# Patient Record
Sex: Female | Born: 1985 | Race: White | Hispanic: No | Marital: Single | Smoking: Never smoker
Health system: Southern US, Community
[De-identification: ages and names within clinical notes are randomized; demographics above are authoritative.]

## PROBLEM LIST (undated history)

## (undated) DIAGNOSIS — E119 Type 2 diabetes mellitus without complications: Secondary | ICD-10-CM

## (undated) HISTORY — PX: AMPUTATION ARM: SHX6593

---

## 2008-11-30 ENCOUNTER — Emergency Department (HOSPITAL_BASED_OUTPATIENT_CLINIC_OR_DEPARTMENT_OTHER): Admission: EM | Admit: 2008-11-30 | Discharge: 2008-12-01 | Payer: Self-pay | Admitting: Emergency Medicine

## 2010-09-24 LAB — GLUCOSE, CAPILLARY
Glucose-Capillary: 269 mg/dL — ABNORMAL HIGH (ref 70–99)
Glucose-Capillary: 321 mg/dL — ABNORMAL HIGH (ref 70–99)

## 2010-09-24 LAB — BASIC METABOLIC PANEL
CO2: 23 mEq/L (ref 19–32)
Calcium: 9.1 mg/dL (ref 8.4–10.5)
Chloride: 103 mEq/L (ref 96–112)
Creatinine, Ser: 0.5 mg/dL (ref 0.4–1.2)
GFR calc Af Amer: >60 mL/min (ref >60)
Sodium: 137 mEq/L (ref 135–145)

## 2010-09-24 LAB — URINE MICROSCOPIC-ADD ON

## 2010-09-24 LAB — URINALYSIS, ROUTINE W REFLEX MICROSCOPIC
Bilirubin Urine: NEGATIVE
Glucose, UA: >1000 mg/dL — AB
Protein, ur: NEGATIVE mg/dL
Urobilinogen, UA: 0.2 mg/dL (ref 0.0–1.0)

## 2010-09-24 LAB — RAPID STREP SCREEN (MED CTR MEBANE ONLY): Streptococcus, Group A Screen (Direct): NEGATIVE

## 2011-10-30 LAB — COMPREHENSIVE METABOLIC PANEL
Alkaline Phosphatase: 128 U/L (ref 50–136)
Bilirubin,Total: 0.4 mg/dL (ref 0.2–1.0)
Chloride: 107 mmol/L (ref 98–107)
Co2: 29 mmol/L (ref 21–32)
EGFR (Non-African Amer.): >60
Glucose: 96 mg/dL (ref 65–99)
Osmolality: 283 (ref 275–301)
SGPT (ALT): 17 U/L
Total Protein: 7.1 g/dL (ref 6.4–8.2)

## 2011-10-30 LAB — CBC
HCT: 39.1 % (ref 35.0–47.0)
HGB: 12.8 g/dL (ref 12.0–16.0)
MCH: 27.8 pg (ref 26.0–34.0)
MCHC: 32.8 g/dL (ref 32.0–36.0)
RBC: 4.61 10*6/uL (ref 3.80–5.20)
RDW: 14.1 % (ref 11.5–14.5)
WBC: 7.6 10*3/uL (ref 3.6–11.0)

## 2011-10-30 LAB — URINALYSIS, COMPLETE
Glucose,UR: >=500 mg/dL (ref 0–75)
Leukocyte Esterase: NEGATIVE
Nitrite: NEGATIVE
Specific Gravity: 1.027 (ref 1.003–1.030)
Squamous Epithelial: 2
WBC UR: 3 /HPF (ref 0–5)

## 2011-10-30 LAB — ACETAMINOPHEN LEVEL: Acetaminophen: 10 ug/mL

## 2011-10-30 LAB — DRUG SCREEN, URINE
Amphetamines, Ur Screen: NEGATIVE (ref ?–1000)
Barbiturates, Ur Screen: NEGATIVE (ref ?–200)
Benzodiazepine, Ur Scrn: POSITIVE (ref ?–200)
Cannabinoid 50 Ng, Ur ~~LOC~~: POSITIVE (ref ?–50)
MDMA (Ecstasy)Ur Screen: NEGATIVE (ref ?–500)
Opiate, Ur Screen: POSITIVE (ref ?–300)

## 2011-10-30 LAB — SALICYLATE LEVEL: Salicylates, Serum: <1.7 mg/dL

## 2011-10-31 ENCOUNTER — Inpatient Hospital Stay: Payer: Self-pay | Admitting: Psychiatry

## 2011-10-31 LAB — HEMOGLOBIN A1C: Hemoglobin A1C: 10.9 % — ABNORMAL HIGH (ref 4.2–6.3)

## 2014-10-09 NOTE — Discharge Summary (Signed)
PATIENT NAME:  Jessica Tapia, Jessica Tapia MR#:  [id number] DATE OF BIRTH:  [birth year]  DATE OF ADMISSION:  10/31/2011 DATE OF DISCHARGE:  10/31/2011  HISTORY OF PRESENT ILLNESS: Ms. Jessica Tapia presented to the emergency department after an overdose of methadone yesterday. When she presented to the emergency department, she had reported that she was diabetic and her glucose had gotten out of control, and she needed some emergent evaluation and treatment of her diabetes.    Later on she acknowledged to the undersign that she presented to the emergency department because she knew that she had taken methadone instead of Percocet. Please see below.  After the patient arrived to the emergency department, she fell into a coma. The emergency department physician gave her Narcan and the patient immediately became alert. She received Narcan dosages up to 6:15 p.m. on 10/30/2011. By midnight of 10/30/2011, the patient remained able to ambulate and was safely out of the methadone influence.   The patient, after becoming able to converse, told the undersign that she had deliberately presented to the emergency department because she was afraid of what would happen to her after the methadone overdose. She gave this statement and story multiple times to multiple treatment team providers during her brief hospital stay; she stated that she did run out of Percocet and turned to her next door neighbor to get a supply. Her next door neighbor gave her methadone instead of Percocet. When the patient began to feel the sedation effect, she then came to the emergency department.   ANCILLARY CLINICAL DATA:  Ms. Jessica Tapia was placed on a NovoLog sliding scale for her glucose. [facility] consulted on the patient for diabetes medication adjustment.   HOSPITAL COURSE: The next day after presenting to the emergency department, Ms. Jessica Tapia was not showing any signs of internal stimulation. She denied any thoughts of harming herself or others. As  mentioned above, she was consistent with her account of what happened. She was describing constructive future goals and hope. She was not showing any disorientation or memory dysfunction.   The patient did repeat to a number of treatment team members that she had been successfully involved in a law suit against a drug company and was going to receive a settlement. This settlement was going to allow her to purchase a house in the [place] area and to be able to bring her children to [place]. Although the actual facts of the matter were not researched, the patient demonstrated no evidence that she was delusional. She had no other content did indicated delusions of grandeur.  CONDITION ON DISCHARGE: As above.   MENTAL STATUS EXAMINATION UPON DISCHARGE: Ms. Jessica Tapia is alert. Her eye contact is good. Her concentration is normal. She is oriented to all spheres. Her memory is intact to immediate, recent, and remote except for the methadone blackout. Her speech shows no dysarthria. Thought process is logical, coherent, and goal directed with no looseness of associations, no tangents. Thought content: No thoughts of harming herself or others. No delusions or hallucinations. Her affect is anxious and at times she shows some histrionic features, however, there is no expansiveness or excessive euphoria. Mood consistent with affect. She is not depressed. She is looking forward to the future. She is grateful that she no longer meets commitment criteria. Her insight is poor regarding the extent of her substance abuse. Her judgment is intact.   ADDITIONAL DATA FROM LABORATORY: Her urine drug screen was positive for cocaine, positive for opiates, and positive for cannabinoids.  ASSESSMENT:   AXIS I:  1. Polysubstance dependence.  2. Anxiety disorder, not otherwise specified.   AXIS II: Deferred.   AXIS III: Diabetes mellitus type 1.   AXIS IV: General medical, primary support group.   AXIS V: 55.    Ms. Jessica Tapia is not at risk to harm himself or others. She agrees to call emergency services immediately for thoughts of harming herself, thoughts of harming others, or distress.   The undersigned emphatically recommended that Ms. Jessica Tapia remain in the psychiatric inpatient unit for further group psychotherapy and to allow time for further education as well as facilitate drug dependence rehabilitation.   The patient would be an excellent candidate for remaining in our behavioral health unit until a residential chemical dependence rehabilitation program could become available for her direct transfer.   However, the patient declines and she is not committable now that she is recovered from acute substance intoxication.  She will sign out AGAINST MEDICAL ADVICE.  She understands that she can return if she changes her mind. ADS information for outpatient services is also given. She is also a candidate for the 12-step method and obtaining a 12-step sponsor. ____________________________ Adelene Amas. Ruqayya Ventress, MD jsw:slb D: 11/01/2011 20:17:28 ET T: 11/02/2011 08:08:45 ET JOB#: [id number]  cc: Adelene Amas. Lygia Olaes, MD, <Dictator> Lester [place] MD ELECTRONICALLY SIGNED 11/05/2011 12:32

## 2014-10-09 NOTE — Discharge Summary (Signed)
PATIENT NAMLeane Para:  Tapia, Jessica Tapia MR#:  [id number] DATE OF BIRTH:  Mar 07, 1986  DATE OF ADMISSION:  10/31/2011 DATE OF DISCHARGE:  10/31/2011  ADDENDUM: Followup appointment was given at [facility] at 10:30 a.m. on 11/06/2011.  DISCHARGE MEDICATIONS:  1. Propranolol 80 mg 1 twice a day. 2. Tapazole 10 mg one every 8 hours. 3. She was to continue on her insulin regimen as recommended by the general medical MD. ____________________________ Adelene AmasJames S. Mardie Kellen, MD jsw:slb D: 11/01/2011 21:35:36 ET T: 11/02/2011 08:34:17 ET JOB#: [id number]  cc: Adelene AmasJames S. Roben Schliep, MD, <Dictator> Lester CarolinaJAMES S Saidah Kempton MD ELECTRONICALLY SIGNED 11/05/2011 12:32

## 2014-10-09 NOTE — Consult Note (Signed)
PATIENT NAME:  Jessica Tapia, SPELLMAN MR#:  [id number] DATE OF BIRTH:  02-Apr-1986  DATE OF CONSULTATION:  10/31/2011  REFERRING PHYSICIAN:  Dr. Jeanie Sewer  CONSULTING PHYSICIAN:  Rolly Pancake. Cherlynn Kaiser, MD  PRIMARY CARE PHYSICIAN: Dr. August Saucer  CHIEF COMPLAINT/REASON FOR CONSULTATION: Hyperglycemia.   HISTORY OF PRESENT ILLNESS: This is a 29 year old female with past medical history of type 1 diabetes, diabetic neuropathy, history of Graves' disease who presented to the hospital secondary to drug overdose secondary to methadone. This reversed with Narcan and she was admitted to Behavioral Medicine. Hospitalist services were contacted for hyperglycemia. Patient's blood sugars have been in the 400 range pretty much most of the day.   Patient clinically is asymptomatic.   REVIEW OF SYSTEMS: CONSTITUTIONAL: No documented fever. No weight gain, no weight loss. EYES: No blurry or double vision. ENT: No tinnitus. No postnasal drip. No redness of the oropharynx. RESPIRATORY: No cough, no wheeze, no hemoptysis. CARDIOVASCULAR: No chest pain, no orthopnea, no palpitations, no syncope. GASTROINTESTINAL: No nausea, no vomiting, no diarrhea, no abdominal pain, no melena, no hematochezia. GENITOURINARY: No dysuria, no hematuria. ENDOCRINE: No polyuria or nocturia. No heat or cold intolerance. HEME: No anemia. No bruising. No bleeding. INTEGUMENTARY: No rashes, no lesions. MUSCULOSKELETAL: No arthritis, no swelling, no gout. NEUROLOGIC: No numbness, no tingling, no ataxia, no seizure-type activity. PSYCH: No anxiety, no insomnia, no ADD.   PAST MEDICAL HISTORY:  1. Type 1 diabetes, insulin-dependent. 2. Graves' disease.  3. Diabetic neuropathy.  4. History of opioid abuse on methadone program.    ALLERGIES: Sulfa drugs which causes hives.   SOCIAL HISTORY: Does smoke about a pack per day, has been smoking since she was age 42. No alcohol abuse. No other illicit drug abuse so she says but her urine toxicology is positive for  opiates, methadone, cannabinoids and also cocaine and benzodiazepines.   CURRENT MEDICATIONS:  1. Tylenol with hydrocodone 10/325, 1 tab every eight hours as needed. Amoxicillin 500 mg t.i.d.  2. Klonopin 1 mg b.i.d. as needed for anxiety.  3. Methimazole 10 mg t.i.d.  4. Propranolol 80 mg b.i.d.   PHYSICAL EXAMINATION:  VITAL SIGNS: Temperature 97.2, pulse 114, respirations 18, blood pressure 112/72. Blood sugar now is down to 99 but prior to that was in the 400s to 500s range.   GENERAL: She is a very anxious appearing female with a pressured speech but in no apparent distress.   HEENT: Atraumatic, normocephalic. Her extraocular muscles are intact. Pupils equal, reactive to light. Sclerae anicteric. No conjunctival injection. No oropharyngeal erythema.   NECK: Supple. There is no jugular venous distention, no bruits, no lymphadenopathy, no thyromegaly.   HEART: Tachycardic, regular. No murmurs, no rubs, no clicks.   LUNGS: Clear to auscultation bilaterally. No rales, no rhonchi, no wheezes.   ABDOMEN: Soft, flat, nontender, nondistended. Has good bowel sounds. No hepatosplenomegaly appreciated.   EXTREMITIES: No evidence of any cyanosis, clubbing, or peripheral edema. Has +2 pedal and radial pulses bilaterally.   NEUROLOGICAL: Patient is alert, awake, oriented x3, very anxious but no other focal motor or sensory deficits appreciated bilaterally.   SKIN: Moist, warm with no rash appreciated.   LYMPHATIC: There is no cervical or axillary lymphadenopathy.   LABORATORY, DIAGNOSTIC AND RADIOLOGICAL DATA: Laboratory exam done yesterday shows BUN 7, creatinine 0.5, sodium 143, potassium 3.4, chloride 107, bicarbonate 29. LFTs are within normal limits. Free thyroxine 2.52. TSH less than 0.010. White cell count 7.6, hemoglobin 12.8, hematocrit 39.1, platelet count 196.   ASSESSMENT AND PLAN:  This is a 29 year old female with a history of type 1 diabetes, Graves' disease, diabetic  neuropathy, polysubstance abuse came into the hospital due to drug overdose from methadone.  1. Type 1 diabetes. Patient's sugars are significantly uncontrolled. Her blood sugars were as high as 400 to 500 ranger earlier today. Patient has received some regular insulin and sliding scale coverage and sugars have improved. As per the patient she is on 70/30 insulin along with sliding scale coverage. I will start her on low dose 70/30 coverage for now and continue sliding scale. Will check hemoglobin A1c and follow her blood sugars.  2. Graves' disease. Patient's TSH is significantly low and free T3 is elevated. She likely has this diagnosis. I will restart her methimazole and also propranolol for now. I would recommend getting an outpatient follow up with endocrinology as she likely will need radioactive ablation at some point once her thyroid levels come down.  3. Tachycardia. I think this is likely related to her hyperthyroid state. As mentioned, I went ahead and started her propranolol which should help with this.  4. Diabetic neuropathy. Patient is currently on no acute medications related to this. If needed we can start some low dose Neurontin.  5. Drug overdose, polysubstance abuse. Patient apparently took a higher dose of methadone, was reversed with Narcan, appears to have pressured speech and is going through withdrawal. Would continue care as per psychiatry for now.  6. Tobacco abuse. Continue with nicotine patch.   Thanks for the consult. Will follow along with you.  TIME SPENT: 50 minutes.  ____________________________ Rolly PancakeVivek J. Cherlynn KaiserSainani, MD vjs:cms D: 10/31/2011 13:06:35 ET T: 10/31/2011 13:24:53 ET  JOB#: [id number] cc: Rolly PancakeVivek J. Cherlynn KaiserSainani, MD, <Dictator> Dr. Rodell Pernaean   VIVEK J SAINANI MD ELECTRONICALLY SIGNED 10/31/2011 15:43

## 2014-10-09 NOTE — Consult Note (Signed)
Brief Consult Note: Diagnosis: 1. Type I DM 2. Grave's Disease 3. Diabetic Neuropathy 4. Methadone overdose.   Patient was seen by consultant.   Consult note dictated.   Orders entered.   Comments: 29 yo female w/ hx of Type 1 DM, Grave's disease, diabetic Neuropathy, came into hospital due to Drug overdose from Methadone.   1. Type I DM - uncontrolled sugars.  - will check HemoglobinA1c.  Pt. is on 70/30 insulin w/ SSI coverage.  - will start low dose 70/30 and SSI coverage and monitor.   2. Grave's Disease  - restart Methimazole, Propranolol for now.  - recommend outpatient follow up w/ Endocrinology.   3. Diabetic Neuropathy - no acute issue  4. Drug Overdose/Mania - cont. care as per Psychiatry.   5. Tobacco abuse - cont. Nicotine patch.   thanks of the consult and follow with you.   Full Code  Job # [id number].  Electronic Signatures: [place] SirenSainani, Latori Beggs J (MD)  (Signed 16-May-13 13:06)  Authored: Brief Consult Note   Last Updated: 16-May-13 13:06 by [place] SirenSainani, Kellyn Mansfield J (MD)

## 2014-10-09 NOTE — H&P (Signed)
PATIENT NAMENALLA, PURDY MR#:  [id number] DATE OF BIRTH:  18-Sep-1985  DATE OF ADMISSION: 10/31/2011  CHIEF COMPLAINT AND IDENTIFYING DATA: Ms. Tyshauna Finkbiner is a 29 year old female presenting to the Emergency Department after taking an overdose of methadone. The methadone overdose was discovered after the patient was in a coma and then immediately returned to completely alert after receiving Narcan.   HISTORY OF PRESENT ILLNESS: The patient was being vague with some of her history. She states that she had run out of Percocet and that a lady next door gave her some pills. Her neighbor described them as Percocet. The patient took an unknown amount. The patient, at the time of the undersigned's exam is still groggy, however, she is oriented to all spheres. Her memory function is still intact except for the memory gap involving the overdose. She denies any thoughts of harming herself or others. She has been placed on involuntary commitment. She has no hallucinations or delusions.   However, she continues with constricted affect, depressed mood and sobbing. She describes how her children were taken away by [employer] when she was a 29 year old and she has recently been in a position to receive them back, however, she has found herself with this current complication and is very worried that this will undermine the return of her children. She has not been on any psychotropic medication other than some Klonopin for feeling on edge prescribed by her primary care physician at the dosage of 1 mg b.i.d. She does have a history of excessive worry, feeling on edge, as well as muscle tension.   She has been given Elavil 10 mg at bedtime for her diabetic peripheral neuropathy by her primary care physician.   PAST PSYCHIATRIC HISTORY: She was admitted to an inpatient psychiatric unit at the age of 10 due to some behavioral disturbance. The specific nature of this disturbance is not known. At this time the patient does  acknowledge that it was due to drug problems and that this eventually resulted in her two children being taken by DSS at the time.   The patient denies any alcohol use.   FAMILY PSYCHIATRIC HISTORY: None known.   SOCIAL HISTORY: Ms. Schaff has been residing with her boyfriend. She recently moved up from [place], [place]. She denies illegal drugs or alcohol. Please see the above history regarding the methadone. She states that she has been taking the equivalent of two 10 mg oxycodone tablets per day on a regular basis. Her two children are living with her father in [place], [place].   Occupation: Disabled. She has critical diabetes mellitus. Please see below. She is currently living and [place].   PAST MEDICAL HISTORY: 1. Diabetes mellitus, type 1. The patient states that she developed the diabetes mellitus after a course of Seroquel and that she is suing the company that makes Seroquel. She states that this law suit is getting ready to settle and that she will have a very substantial financial settlement.  2. She has diabetic peripheral neuropathy with significant intermittent pain.   ALLERGIES: No known drug allergies.   MEDICATIONS: As above insulin she states is Lantus 70/30, 4 units b.i.d. she uses a sliding scale of NovoLog.   LABORATORY, DIAGNOSTIC AND RADIOLOGICAL DATA: Alcohol, CBC and complete metabolic panel unremarkable. TSH less than 1.010 and the patient does state that she has history of Graves' disease.   REVIEW OF SYSTEMS: Constitutional, HEENT, mouth, neurologic, psychiatric, cardiovascular, respiratory, gastrointestinal, genitourinary, skin, musculoskeletal, hematologic, lymphatic, endocrine, metabolic all  unremarkable.   PHYSICAL EXAMINATION:  VITAL SIGNS: Temperature 97.0, pulse 82, respiratory rate 18, blood pressure 91/55.   The patient was examined with Emergency Department female staff escort.   GENERAL APPEARANCE: Well-developed, well-nourished young  female appearing her chronologic age with no abnormal involuntary movements. She has no cachexia. Grooming mildly disheveled. Hygiene normal.  HEAD: Normocephalic, atraumatic.   OROPHARYNX: Clear without erythema.   EXTREMITIES: No cyanosis, clubbing, or edema.    SKIN: Normal turgor. No rashes.   NECK: Supple, nontender, no masses.   LUNGS: Clear to auscultation. No wheezing, rhonchi, or rales.   CARDIOVASCULAR: Regular rate and rhythm. No murmurs, rubs, or gallops.   ABDOMEN: Nondistended. Bowel sounds positive, soft, nontender, no masses.   GENITOURINARY: Deferred.   NEUROLOGIC: Cranial nerves II through XII intact. General sensory intact throughout to light touch. Motor 5/5 strength throughout. Coordination intact finger to nose bilaterally. Deep tendon reflexes normal strength and symmetry throughout. No Babinski.   MENTAL STATUS EXAM: Ms. Logan Boresvans is still showing decreased concentration and grogginess. Her eye contact is intermittent. She is oriented to all spheres. Her memory is intact to immediate, recent, and remote except for the overdose blackout. Her fund of knowledge, use of language and intelligence are grossly normal. Speech involves a slightly decreased rate, slightly flat prosody, slight dysarthria. Thought process is logical, coherent, and goal directed. No looseness of associations. No tangents. Thought content: She denies any suicidal intent. She has no thoughts of harming others. She denies hallucinations or delusions. Affect is constricted with tears. Mood is depressed. Insight is poor. Judgment is impaired.   ASSESSMENT:  AXIS I:  1. Mood disorder, not otherwise specified.  2. Anxiety disorder, not otherwise specified.  3. Rule out polysubstance dependence.   AXIS II: Deferred.   AXIS III: Diabetes mellitus, type 1.   AXIS IV: General medical, primary support group, economic.   AXIS V: 30.   Ms. Ludmilla Gapinski clearly has impaired judgement as well as  insight. She is just coming off of a potentially lethal overdose. She is at risk for lethal self neglect. Also suicidal intent with the overdose has not been ruled out. She is under involuntary commitment.   PLAN:  1. Therefore, the undersigned will admit Ms. Damron to the inpatient behavioral health unit.  2. She will undergo milieu and group psychotherapy.  3. At this time she will be placed on a Novolin sliding scale with a general medical consult for dosing her Lantus insulin.  4. Regarding psychotropic agents, she will be monitored for any need to start antidepressant or modify anti-anxiety therapy. Ongoing use of benzodiazepines will be discouraged as much as possible.   ____________________________ Adelene AmasJames S. Jasmene Goswami, MD jsw:cms D: 10/31/2011 11:24:00 ET T: 10/31/2011 11:49:13 ET JOB#: [id number]  cc: Adelene AmasJames S. Daje Stark, MD, <Dictator> Lester CarolinaJAMES S Oceana Walthall MD ELECTRONICALLY SIGNED 10/31/2011 16:28

## 2015-05-31 ENCOUNTER — Emergency Department (HOSPITAL_COMMUNITY): Payer: Self-pay

## 2015-05-31 ENCOUNTER — Emergency Department (HOSPITAL_COMMUNITY)
Admission: EM | Admit: 2015-05-31 | Discharge: 2015-05-31 | Disposition: A | Discharge status: Home / Self Care | Payer: Self-pay | Attending: Emergency Medicine | Admitting: Emergency Medicine

## 2015-05-31 ENCOUNTER — Encounter (HOSPITAL_COMMUNITY): Payer: Self-pay | Admitting: Emergency Medicine

## 2015-05-31 DIAGNOSIS — R739 Hyperglycemia, unspecified: Secondary | ICD-10-CM

## 2015-05-31 DIAGNOSIS — Z3202 Encounter for pregnancy test, result negative: Secondary | ICD-10-CM | POA: Insufficient documentation

## 2015-05-31 DIAGNOSIS — Z79899 Other long term (current) drug therapy: Secondary | ICD-10-CM | POA: Insufficient documentation

## 2015-05-31 DIAGNOSIS — E1165 Type 2 diabetes mellitus with hyperglycemia: Secondary | ICD-10-CM | POA: Insufficient documentation

## 2015-05-31 DIAGNOSIS — R109 Unspecified abdominal pain: Secondary | ICD-10-CM | POA: Insufficient documentation

## 2015-05-31 DIAGNOSIS — E860 Dehydration: Secondary | ICD-10-CM | POA: Insufficient documentation

## 2015-05-31 HISTORY — DX: Type 2 diabetes mellitus without complications: E11.9

## 2015-05-31 LAB — URINALYSIS, ROUTINE W REFLEX MICROSCOPIC
BILIRUBIN URINE: NEGATIVE
Glucose, UA: >1000 mg/dL — AB
Hgb urine dipstick: NEGATIVE
KETONES UR: NEGATIVE mg/dL
LEUKOCYTES UA: NEGATIVE
NITRITE: NEGATIVE
PROTEIN: NEGATIVE mg/dL
Specific Gravity, Urine: 1.027 (ref 1.005–1.030)
pH: 7 (ref 5.0–8.0)

## 2015-05-31 LAB — COMPREHENSIVE METABOLIC PANEL
ALK PHOS: 386 U/L — AB (ref 38–126)
ALT: 35 U/L (ref 14–54)
AST: 28 U/L (ref 15–41)
Albumin: 3 g/dL — ABNORMAL LOW (ref 3.5–5.0)
Anion gap: 13 (ref 5–15)
BILIRUBIN TOTAL: 0.4 mg/dL (ref 0.3–1.2)
BUN: 11 mg/dL (ref 6–20)
CALCIUM: 8.2 mg/dL — AB (ref 8.9–10.3)
CO2: 22 mmol/L (ref 22–32)
Chloride: 86 mmol/L — ABNORMAL LOW (ref 101–111)
Creatinine, Ser: 0.74 mg/dL (ref 0.44–1.00)
GFR calc Af Amer: >60 mL/min (ref >60)
Glucose, Bld: 964 mg/dL (ref 65–99)
POTASSIUM: 4.6 mmol/L (ref 3.5–5.1)
Sodium: 121 mmol/L — ABNORMAL LOW (ref 135–145)
TOTAL PROTEIN: 6.6 g/dL (ref 6.5–8.1)

## 2015-05-31 LAB — BLOOD GAS, VENOUS
ACID-BASE DEFICIT: 2.2 mmol/L — AB (ref 0.0–2.0)
BICARBONATE: 22.6 meq/L (ref 20.0–24.0)
FIO2: 0.21
O2 SAT: 91 %
PATIENT TEMPERATURE: 98.6
TCO2: 20.7 mmol/L (ref 0–100)
pCO2, Ven: 41 mmHg — ABNORMAL LOW (ref 45.0–50.0)
pH, Ven: 7.359 — ABNORMAL HIGH (ref 7.250–7.300)
pO2, Ven: 70 mmHg — ABNORMAL HIGH (ref 30.0–45.0)

## 2015-05-31 LAB — RAPID URINE DRUG SCREEN, HOSP PERFORMED
Amphetamines: NOT DETECTED
BARBITURATES: NOT DETECTED
Benzodiazepines: NOT DETECTED
Cocaine: NOT DETECTED
Opiates: NOT DETECTED
Tetrahydrocannabinol: NOT DETECTED

## 2015-05-31 LAB — I-STAT BETA HCG BLOOD, ED (MC, WL, AP ONLY)

## 2015-05-31 LAB — URINE MICROSCOPIC-ADD ON

## 2015-05-31 LAB — CBG MONITORING, ED
GLUCOSE-CAPILLARY: 405 mg/dL — AB (ref 65–99)
Glucose-Capillary: >600 mg/dL (ref 65–99)
Glucose-Capillary: >600 mg/dL (ref 65–99)

## 2015-05-31 LAB — LIPASE, BLOOD: LIPASE: 44 U/L (ref 11–51)

## 2015-05-31 MED ORDER — INSULIN ASPART 100 UNIT/ML ~~LOC~~ SOLN
10.0000 [IU] | Freq: Once | SUBCUTANEOUS | Status: AC
  Administered 2015-05-31: 10 [IU] via INTRAVENOUS
  Filled 2015-05-31: qty 1

## 2015-05-31 MED ORDER — IOHEXOL 300 MG/ML  SOLN
50.0000 mL | Freq: Once | INTRAMUSCULAR | Status: DC | PRN
  Administered 2015-05-31: 50 mL via ORAL
  Filled 2015-05-31: qty 50

## 2015-05-31 MED ORDER — FENTANYL CITRATE (PF) 100 MCG/2ML IJ SOLN
50.0000 ug | INTRAMUSCULAR | Status: DC | PRN
  Administered 2015-05-31: 50 ug via INTRAVENOUS
  Filled 2015-05-31: qty 2

## 2015-05-31 MED ORDER — SODIUM CHLORIDE 0.9 % IV SOLN
1000.0000 mL | Freq: Once | INTRAVENOUS | Status: AC
  Administered 2015-05-31: 1000 mL via INTRAVENOUS

## 2015-05-31 MED ORDER — IOHEXOL 300 MG/ML  SOLN
100.0000 mL | Freq: Once | INTRAMUSCULAR | Status: AC | PRN
  Administered 2015-05-31: 100 mL via INTRAVENOUS

## 2015-05-31 MED ORDER — SODIUM CHLORIDE 0.9 % IV BOLUS (SEPSIS)
1000.0000 mL | Freq: Once | INTRAVENOUS | Status: AC
  Administered 2015-05-31: 1000 mL via INTRAVENOUS

## 2015-05-31 MED ORDER — ONDANSETRON HCL 4 MG/2ML IJ SOLN
4.0000 mg | Freq: Once | INTRAMUSCULAR | Status: DC
Start: 2015-05-31 — End: 2015-05-31

## 2015-05-31 MED ORDER — SODIUM CHLORIDE 0.9 % IV SOLN
1000.0000 mL | INTRAVENOUS | Status: DC
  Administered 2015-05-31: 1000 mL via INTRAVENOUS

## 2015-05-31 MED ORDER — PROMETHAZINE HCL 25 MG PO TABS: 25.0000 mg | ORAL_TABLET | Freq: Four times a day (QID) | ORAL | Status: DC | PRN

## 2015-05-31 MED ORDER — KETOROLAC TROMETHAMINE 30 MG/ML IJ SOLN
30.0000 mg | Freq: Once | INTRAMUSCULAR | Status: AC
  Administered 2015-05-31: 30 mg via INTRAVENOUS
  Filled 2015-05-31: qty 1

## 2015-05-31 MED ORDER — HALOPERIDOL LACTATE 5 MG/ML IJ SOLN
2.0000 mg | Freq: Once | INTRAMUSCULAR | Status: AC
  Administered 2015-05-31: 2 mg via INTRAVENOUS
  Filled 2015-05-31: qty 1

## 2015-05-31 NOTE — ED Notes (Signed)
Pt is refusing blood draw until she speaks with the MD about pain meds

## 2015-05-31 NOTE — ED Notes (Signed)
MD at bedside. 

## 2015-05-31 NOTE — ED Notes (Signed)
This Consulting civil engineerCharge RN was informed by the Primary RN that the Pt is upset w/ EDP's orders and wanting to leave.  This Probation officerCharge RN and Primary RN spoke w/ the Pt.  Pt demanding IV Benadryl and "something stronger" for pain.  Pt reports that all medications make her itch and she knows that Toradol will not work.  This Consulting civil engineerCharge RN and the Primary RN informed the Pt of our concerns w/ her leaving.  Eventually, the Pt agreed to stay and allow us to complete the EDP's orders.

## 2015-05-31 NOTE — Discharge Instructions (Signed)
Hyperglycemia °Hyperglycemia occurs when the glucose (sugar) in your blood is too high. Hyperglycemia can happen for many reasons, but it most often happens to people who do not know they have diabetes or are not managing their diabetes properly.  °CAUSES  °Whether you have diabetes or not, there are other causes of hyperglycemia. Hyperglycemia can occur when you have diabetes, but it can also occur in other situations that you might not be as aware of, such as: °Diabetes °· If you have diabetes and are having problems controlling your blood glucose, hyperglycemia could occur because of some of the following reasons: °¨ Not following your meal plan. °¨ Not taking your diabetes medications or not taking it properly. °¨ Exercising less or doing less activity than you normally do. °¨ Being sick. °Pre-diabetes °· This cannot be ignored. Before people develop Type 2 diabetes, they almost always have "pre-diabetes." This is when your blood glucose levels are higher than normal, but not yet high enough to be diagnosed as diabetes. Research has shown that some long-term damage to the body, especially the heart and circulatory system, may already be occurring during pre-diabetes. If you take action to manage your blood glucose when you have pre-diabetes, you may delay or prevent Type 2 diabetes from developing. °Stress °· If you have diabetes, you may be "diet" controlled or on oral medications or insulin to control your diabetes. However, you may find that your blood glucose is higher than usual in the hospital whether you have diabetes or not. This is often referred to as "stress hyperglycemia." Stress can elevate your blood glucose. This happens because of hormones put out by the body during times of stress. If stress has been the cause of your high blood glucose, it can be followed regularly by your caregiver. That way he/she can make sure your hyperglycemia does not continue to get worse or progress to  diabetes. °Steroids °· Steroids are medications that act on the infection fighting system (immune system) to block inflammation or infection. One side effect can be a rise in blood glucose. Most people can produce enough extra insulin to allow for this rise, but for those who cannot, steroids make blood glucose levels go even higher. It is not unusual for steroid treatments to "uncover" diabetes that is developing. It is not always possible to determine if the hyperglycemia will go away after the steroids are stopped. A special blood test called an A1c is sometimes done to determine if your blood glucose was elevated before the steroids were started. °SYMPTOMS °· Thirsty. °· Frequent urination. °· Dry mouth. °· Blurred vision. °· Tired or fatigue. °· Weakness. °· Sleepy. °· Tingling in feet or leg. °DIAGNOSIS  °Diagnosis is made by monitoring blood glucose in one or all of the following ways: °· A1c test. This is a chemical found in your blood. °· Fingerstick blood glucose monitoring. °· Laboratory results. °TREATMENT  °First, knowing the cause of the hyperglycemia is important before the hyperglycemia can be treated. Treatment may include, but is not be limited to: °· Education. °· Change or adjustment in medications. °· Change or adjustment in meal plan. °· Treatment for an illness, infection, etc. °· More frequent blood glucose monitoring. °· Change in exercise plan. °· Decreasing or stopping steroids. °· Lifestyle changes. °HOME CARE INSTRUCTIONS  °· Test your blood glucose as directed. °· Exercise regularly. Your caregiver will give you instructions about exercise. Pre-diabetes or diabetes which comes on with stress is helped by exercising. °· Eat wholesome,   balanced meals. Eat often and at regular, fixed times. Your caregiver or nutritionist will give you a meal plan to guide your sugar intake.  Being at an ideal weight is important. If needed, losing as little as 10 to 15 pounds may help improve blood  glucose levels. SEEK MEDICAL CARE IF:   You have questions about medicine, activity, or diet.  You continue to have symptoms (problems such as increased thirst, urination, or weight gain). SEEK IMMEDIATE MEDICAL CARE IF:   You are vomiting or have diarrhea.  Your breath smells fruity.  You are breathing faster or slower.  You are very sleepy or incoherent.  You have numbness, tingling, or pain in your feet or hands.  You have chest pain.  Your symptoms get worse even though you have been following your caregiver's orders.  If you have any other questions or concerns.   This information is not intended to replace advice given to you by your health care provider. Make sure you discuss any questions you have with your health care provider.   Document Released: 11/27/2000 Document Revised: 08/26/2011 Document Reviewed: 02/07/2015 Elsevier Interactive Patient Education 2016 [employer]   Emergency Department Resource Guide 1) Find a Doctor and Pay Out of Pocket Although you won't have to find out who is covered by your insurance plan, it is a good idea to ask around and get recommendations. You will then need to call the office and see if the doctor you have chosen will accept you as a new patient and what types of options they offer for patients who are self-pay. Some doctors offer discounts or will set up payment plans for their patients who do not have insurance, but you will need to ask so you aren't surprised when you get to your appointment.  2) Contact Your Local Health Department Not all health departments have doctors that can see patients for sick visits, but many do, so it is worth a call to see if yours does. If you don't know where your local health department is, you can check in your phone book. The CDC also has a tool to help you locate your state's health department, and many state websites also have listings of all of their local health departments.  3) Find a  Walk-in Clinic If your illness is not likely to be very severe or complicated, you may want to try a walk in clinic. These are popping up all over the country in pharmacies, drugstores, and shopping centers. They're usually staffed by nurse practitioners or physician assistants that have been trained to treat common illnesses and complaints. They're usually fairly quick and inexpensive. However, if you have serious medical issues or chronic medical problems, these are probably not your best option.  No Primary Care Doctor: - Call Health Connect at  [phone] - they can help you locate a primary care doctor that  accepts your insurance, provides certain services, etc. - Physician Referral Service- [phone]  Chronic Pain Problems: Organization         Address  Phone   Notes  [facility]  [phone] Patients need to be referred by their primary care doctor.   Medication Assistance: Organization         Address  Phone   Notes  [facility] [address] [place], [place] [phone] --Must be a resident of [place] [facility] [place] -- Must have NO insurance coverage whatsoever (no Medicaid/ Medicare, etc.) --  The pt. MUST have a primary care doctor that directs their care regularly and follows them in the community   Med[facility]ist  [phone]   [employer]  [phone]    Agencies that provide inexpensive medical care: Organization         Address  Phone   Notes  [place]  [phone]   [facility]    [phone]   [facility] [place], [place] [address] [phone]   [place] [address] [place] [phone]   Planned Parenthood    [phone]   [facility]    [phone]   [place] [address], [place] Phone:  [phone], Fax:  [phone] Hours of Operation:  9 am - 6 pm, M-F.  Also accepts Medicaid/Medicare and self-pay.  [facility] [place] [address], [address], [place] Phone: [phone], Fax: [phone]. Hours of Operation:  8:30 am - 5:30 pm, M-F.  Also accepts Medicaid and self-pay.  [facility] [place] [address] [place] Phone: [phone]   [place], [place], [place] [phone], Ext. [phone] Mondays & Thursdays: 7-9 AM.  First 15 patients are seen on a first come, first serve basis.    [place] Providers:  Organization         Address  Phone   Notes  [facility] [address], [place] [phone] Also accepts self-pay patients.  [facility] [id number] [place], [place]  [phone]   [place], [address], [place] [phone]   [facility] [address] [place] [phone]   Lucianne Lei [address], [place]   [phone] Only accepts [place] Access [place] patients after they have their name applied to their card.   Self-Pay (no insurance) in [facility]:  Organization         Address  Phone   Notes  Sickle Cell Patients, [facility] [place] [phone]   [facility] [place] [phone]   [facility] [place]  [place], [place], [place] [phone]   [facility]/Dr. Osei-Bonsu  [address] [place] or [address], [place] [phone] Phone number for both [place] and [place] locations is the same.  Urgent Medical and [facility] [address] [place] [phone]   [facility] [address], [place] or [address] [phone]    [facility] [address], [place] [phone], phone; [phone], fax Sees patients 1st and 3rd Saturday of every month.  Must not qualify for public or private insurance (i.e. Medicaid, Medicare, [place] Health Choice, Veterans' Benefits)  Household income should be no more than 200% of the poverty level The clinic cannot treat you if you are pregnant or think you are pregnant  Sexually transmitted diseases are not treated at the clinic.    Dental Care: Organization         Address  Phone  Notes  [facility] [phone]  [place] [phone] Accepts children up to age 50 who are enrolled in [place] or [place]; pregnant women with a Medicaid card; and children who have applied for Medicaid or [place] Health Choice, but were declined, whose parents can pay a reduced fee at time of service.  [facility]  [address], [place] [phone] Accepts children up to age 72 who are enrolled in [place] or [place]; pregnant women with a Medicaid card; and children who have applied for Medicaid or [place] Health Choice, but were declined, whose parents can pay a reduced fee at time of service.  [place] Adult Dental Access PROGRAM  [place] [phone] Patients are seen by appointment only. Walk-ins are not accepted. [place] will see patients 9 years of age and older. Monday - Tuesday (8am-5pm) Most Wednesdays (8:30-5pm) $30 per visit, cash only  [facility] Adult Dental Access PROGRAM  [address], [facility] [phone] Patients are seen by appointment only. Walk-ins are not accepted. [place] will see patients 49 years of age and older. One Wednesday Evening (Monthly: Volunteer Based).  $30 per visit, cash only  [place]  [phone] for adults; Children under age 97, call  Graduate Pediatric Dentistry at [phone]. Children aged 38-14, please call [phone] to request a pediatric application.  Dental services are provided in all areas of dental care including fillings, crowns and bridges, complete and partial dentures, implants, gum treatment, root canals, and extractions. Preventive care is also provided. Treatment is provided to both adults and children. Patients are selected via a lottery and there is often a waiting list.   [facility] [address] [place]  [phone] [email or web address]   [facility] [address] [place], [place] [phone], Ext. [phone] Second and Fourth Thursday of each month, opens at 6:30 AM; Clinic ends at 9 AM.  Patients are seen on a first-come first-served basis, and a limited number are seen during each clinic.   [facility]  [address] [place]ard Danker [place], [place] [phone]   Eligibility Requirements You must have lived in [place], [place], or [place] counties for at least the last three months.   You cannot be eligible for state or federal sponsored [employer], including [employer], [place], or [employer].   You generally cannot be eligible for healthcare insurance through your employer.    How to apply: Eligibility screenings are held every Tuesday and Wednesday afternoon from 1:00 pm until 4:00 pm. You do not need an appointment for the interview!  [facility] [address], [place], [place]   [place] Department  [place]  [phone]    Behavioral Health Resources in the Community: Intensive Outpatient Programs Organization         Address  Phone  Notes  [place] [address] [place], [place] [phone]   [facility] Outpatient [address] [place], [place]   ADS: Alcohol [facility] Drug S[facility] [address] [place], [place]   [place] [address]  [place], [place] or [phone]   Substance Abuse Resources Organization         Address  Phone  Notes  Alcohol  and Drug Services  [phone]   [facility]  [phone]   [facility] [place]  [phone]   Floydene Flock  [phone]   Residential & Outpatient Substance Abuse Program  [phone]   Psychological Services Organization         Address  Phone  Notes  [facility] Health  [phone]   [facility]  [phone]   [facility] [address], [place] [phone] or [phone]    Mobile Crisis Teams Organization         Address  Phone  Notes  [facility], Mobile Crisis Care Unit  [phone]   [facility]  [address] [place], [place] [phone]   Doristine Locks [address] [place] [phone]    Self-Help/Support Groups Organization         Address  Phone             Notes  [facility] [place] - variety of support groups  336- I7437963 Call for more information  Narcotics Anonymous (NA), [facility] [address], [employer] [place]  2 meetings at this location   Statistician         Address  Phone  Notes  [facility] [address],    [place]  [phone]   [facility]  [address], [place] [address], [place], [place] [phone]   [facility] [address] [place], [place] [phone] Admissions: 8am-3pm M-F  [facility] [address]    [place], [place] [phone]   [facility] [address] [place], [place], [place] [phone]   [facility] [address],  [place], [place] [phone]   [facility] - Intensive Outpatient [address], [place], [place] [phone]   [facility] ([facility] [address] [place].,  [place], [place] [phone] or [phone]   [facility] ([facility]) [address] [place], [place] [phone] Accepts Medicaid  [facility] [place] [address]  [place] [phone] Substance Abuse/Addiction Treatment   [facility] Organization         Address  Phone  Notes  [facility]  [phone]   Angie Fava, PhD [address] Ervin Knack [place], [place]   [phone] or [phone]   [facility]   [address] [place], [place] [phone]   [facility] [address], [place], [place] [phone] Insurance/Medicaid/sponsorship through [facility] [address]                                    [place], [place] [phone] Therapy/tele-psych/case  [facility] [address], [place] [phone]    Dr. Lolly Mustache  [phone]   [facility] [place]  [employer] [facility] Dept. 1) [address] [place] 2) [address] [place] 3)  [address], [place] [phone]  [phone]   [facility] Child Abuse Hotline 562-695-7806 or [phone] (After Hours)

## 2015-05-31 NOTE — ED Notes (Signed)
Patient was alert, oriented and stable upon discharge. RN went over AVS and patient had no further questions. Pt provided with two sandwiches.

## 2015-05-31 NOTE — ED Notes (Signed)
Patient here with complaints of hyperglycemia. [place] from [place]. BS >600.

## 2015-05-31 NOTE — ED Notes (Signed)
Patient transported to CT 

## 2015-05-31 NOTE — ED Provider Notes (Addendum)
CSN: [id number]     Arrival date & time 05/31/15  [id number] History   First MD Initiated Contact with Patient 05/31/15 [phone]     Chief Complaint  Patient presents with  . Hyperglycemia      HPI Patient presents to the emergency department [place] [employer] [place] where she just came to [place].  She presents with hyperglycemia.  She is a known diabetic.  She presents complaining of abdominal pain as well.  She is moving to [place] to live with her sister.  Denies fever productive cough.  Denies chills.  Denies dysuria but does report urinary frequency.   Past Medical History  Diagnosis Date  . Diabetes mellitus without complication [facility](HCC)    Past Surgical History  Procedure Laterality Date  . Amputation arm     No family history on file. Social History  Substance Use Topics  . Smoking status: Never Smoker   . Smokeless tobacco: None  . Alcohol Use: No   OB History    No data available     Review of Systems  All other systems reviewed and are negative.     Allergies  Review of patient's allergies indicates no known allergies.  Home Medications   Prior to Admission medications   Medication Sig Start Date End Date Taking? Authorizing Provider  ALPRAZolam Prudy Feeler(XANAX) 0.5 MG tablet Take 0.5 mg by mouth 2 (two) times daily as needed. 03/15/15  Yes Historical Provider, MD  Multiple Vitamin (MULTIVITAMIN WITH MINERALS) TABS tablet Take 1 tablet by mouth daily.   Yes Historical Provider, MD  promethazine (PHENERGAN) 25 MG tablet Take 1 tablet (25 mg total) by mouth every 6 (six) hours as needed for nausea or vomiting. 05/31/15   Azalia BilisKevin Damarkus Balis, MD   Lantus 12 units in the morning and 12 units at night.   NovoLog sliding scale   BP 114/77 mmHg  Pulse 83  Temp(Src) 98 F (36.7 C) (Oral)  Resp 16  SpO2 100%  LMP  Physical Exam  Constitutional: She is oriented to person, place, and time. She appears well-developed and well-nourished. No distress.   HENT:  Head: Normocephalic and atraumatic.  Eyes: EOM are normal.  Neck: Normal range of motion.  Cardiovascular: Normal rate, regular rhythm and normal heart sounds.   Pulmonary/Chest: Effort normal and breath sounds normal.  Abdominal: Soft. She exhibits no distension. There is no tenderness.  Musculoskeletal: Normal range of motion.  Neurological: She is alert and oriented to person, place, and time.  Skin: Skin is warm and dry.  Psychiatric: She has a normal mood and affect. Judgment normal.  Nursing note and vitals reviewed.   ED Course  Procedures (including critical care time) Labs Review Labs Reviewed  COMPREHENSIVE METABOLIC PANEL - Abnormal; Notable for the following:    Sodium 121 (*)    Chloride 86 (*)    Glucose, Bld 964 (*)    Calcium 8.2 (*)    Albumin 3.0 (*)    Alkaline Phosphatase 386 (*)    All other components within normal limits  BLOOD GAS, VENOUS - Abnormal; Notable for the following:    pH, Ven 7.359 (*)    pCO2, Ven 41.0 (*)    pO2, Ven 70.0 (*)    Acid-base deficit 2.2 (*)    All other components within normal limits  URINALYSIS, ROUTINE W REFLEX MICROSCOPIC (NOT AT [facility]) - Abnormal; Notable for the following:    Glucose, UA >1000 (*)    All other  components within normal limits  URINE MICROSCOPIC-ADD ON - Abnormal; Notable for the following:    Squamous Epithelial / LPF 0-5 (*)    Bacteria, UA RARE (*)    All other components within normal limits  CBG MONITORING, ED - Abnormal; Notable for the following:    Glucose-Capillary >600 (*)    All other components within normal limits  CBG MONITORING, ED - Abnormal; Notable for the following:    Glucose-Capillary >600 (*)    All other components within normal limits  CBG MONITORING, ED - Abnormal; Notable for the following:    Glucose-Capillary >600 (*)    All other components within normal limits  CBG MONITORING, ED - Abnormal; Notable for the following:    Glucose-Capillary 405 (*)    All  other components within normal limits  LIPASE, BLOOD  URINE RAPID DRUG SCREEN, HOSP PERFORMED  I-STAT BETA HCG BLOOD, ED ([facility], [facility], [facility] ONLY)  CBG MONITORING, ED    Imaging Review Ct Abdomen Pelvis W Contrast  05/31/2015  CLINICAL DATA:  Abdominal pain.  Hyperglycemia. EXAM: CT ABDOMEN AND PELVIS WITH CONTRAST TECHNIQUE: Multidetector CT imaging of the abdomen and pelvis was performed using the standard protocol following bolus administration of intravenous contrast. CONTRAST:  OMNIPAQUE IOHEXOL 300 MG/ML  SOLN COMPARISON:  None. FINDINGS: Normal hepatic contour. No discrete hepatic lesions. Post cholecystectomy. No intra extrahepatic bili duct dilatation. No ascites. There is symmetric enhancement of the bilateral kidneys. No definite renal stones in this postcontrast examination. No discrete renal lesions. No urinary obstruction or perinephric stranding. Normal appearance of the bilateral adrenal glands, pancreas and spleen. Moderate colonic stool burden without evidence of enteric obstruction. Normal appearance of the terminal ileum and appendix. No pneumoperitoneum, pneumatosis or portal venous gas. Normal caliber of the abdominal aorta. The major branch vessels of the abdominal aorta appear widely patent on this non CTA examination. Incidental note is made the accessory right renal artery which supplies the inferior pole the right kidney. No bulky retroperitoneal, mesenteric, pelvic or inguinal lymphadenopathy. Normal appearance of the pelvic organs. No discrete adnexal lesion. The urinary bladder is distended but otherwise normal in appearance. No free fluid in the pelvic cul-de-sac. Limited visualization of lower thorax demonstrates minimal dependent subpleural ground-glass atelectasis. No focal airspace opacities. No pleural effusion. Normal heart size.  No pericardial effusion. No acute or aggressive osseous abnormalities. Mild diffuse body wall anasarca. Regional soft tissues appear otherwise  normal. IMPRESSION: 1. No explanation for patient's abdominal pain. 2. Nonspecific apparent mild diffuse body wall anasarca, the etiology of which is not depicted on this examination. No definitive pleural effusions or evidence of cardiomegaly to suggest congestive heart failure. Electronically Signed   By: Simonne Come M.D.   On: 05/31/2015 13:13   I have personally reviewed and evaluated these images and lab results as part of my medical decision-making.   EKG Interpretation None      MDM   Final diagnoses:  Hyperglycemia  Dehydration  Abdominal pain, unspecified abdominal location    Blood sugar improved in the emergency department after aggressive IV hydration.  CT head and pelvis without pathology.  She does report that she has her Lantus and NovoLog and does not need any prescriptions.  She's been ambulatory in the emergency department.  She is concerned because she is not sure where she is going to stay tonight.  She's been given outpatient resources including numbers and locations of homeless shelters.  At this time there is no indication for  admission the hospital.    Azalia Bilis, MD 05/31/15 1503  Azalia Bilis, MD 06/09/15 [phone]

## 2015-05-31 NOTE — ED Notes (Signed)
Bed: WA07 Expected date:  Expected time:  Means of arrival:  Comments: hyperglycemia 

## 2015-05-31 NOTE — ED Notes (Signed)
Pt is refusing blood draw until she gets "comfortable"

## 2015-06-09 ENCOUNTER — Encounter (HOSPITAL_COMMUNITY): Payer: Self-pay | Admitting: *Deleted

## 2015-06-09 ENCOUNTER — Emergency Department (HOSPITAL_COMMUNITY)
Admission: EM | Admit: 2015-06-09 | Discharge: 2015-06-09 | Disposition: A | Discharge status: Home / Self Care | Payer: Self-pay | Attending: Emergency Medicine | Admitting: Emergency Medicine

## 2015-06-09 DIAGNOSIS — R112 Nausea with vomiting, unspecified: Secondary | ICD-10-CM | POA: Insufficient documentation

## 2015-06-09 DIAGNOSIS — Z3202 Encounter for pregnancy test, result negative: Secondary | ICD-10-CM | POA: Insufficient documentation

## 2015-06-09 DIAGNOSIS — E1165 Type 2 diabetes mellitus with hyperglycemia: Secondary | ICD-10-CM | POA: Insufficient documentation

## 2015-06-09 DIAGNOSIS — R109 Unspecified abdominal pain: Secondary | ICD-10-CM | POA: Insufficient documentation

## 2015-06-09 DIAGNOSIS — Z79899 Other long term (current) drug therapy: Secondary | ICD-10-CM | POA: Insufficient documentation

## 2015-06-09 DIAGNOSIS — R739 Hyperglycemia, unspecified: Secondary | ICD-10-CM

## 2015-06-09 DIAGNOSIS — R630 Anorexia: Secondary | ICD-10-CM | POA: Insufficient documentation

## 2015-06-09 DIAGNOSIS — M545 Low back pain: Secondary | ICD-10-CM | POA: Insufficient documentation

## 2015-06-09 LAB — CBG MONITORING, ED: Glucose-Capillary: 503 mg/dL — ABNORMAL HIGH (ref 65–99)

## 2015-06-09 LAB — CBC
HEMATOCRIT: 32.4 % — AB (ref 36.0–46.0)
Hemoglobin: 10.4 g/dL — ABNORMAL LOW (ref 12.0–15.0)
MCH: 24 pg — AB (ref 26.0–34.0)
MCHC: 32.1 g/dL (ref 30.0–36.0)
MCV: 74.8 fL — ABNORMAL LOW (ref 78.0–100.0)
Platelets: 284 10*3/uL (ref 150–400)
RBC: 4.33 MIL/uL (ref 3.87–5.11)
RDW: 17.5 % — AB (ref 11.5–15.5)
WBC: 7.1 10*3/uL (ref 4.0–10.5)

## 2015-06-09 LAB — COMPREHENSIVE METABOLIC PANEL
ALK PHOS: 244 U/L — AB (ref 38–126)
ALT: 23 U/L (ref 14–54)
AST: 27 U/L (ref 15–41)
Albumin: 3.3 g/dL — ABNORMAL LOW (ref 3.5–5.0)
Anion gap: 10 (ref 5–15)
BUN: 21 mg/dL — ABNORMAL HIGH (ref 6–20)
CALCIUM: 9.2 mg/dL (ref 8.9–10.3)
CO2: 21 mmol/L — AB (ref 22–32)
CREATININE: 0.47 mg/dL (ref 0.44–1.00)
Chloride: 94 mmol/L — ABNORMAL LOW (ref 101–111)
GFR calc non Af Amer: >60 mL/min (ref >60)
Glucose, Bld: 522 mg/dL — ABNORMAL HIGH (ref 65–99)
Potassium: 4.9 mmol/L (ref 3.5–5.1)
SODIUM: 125 mmol/L — AB (ref 135–145)
Total Bilirubin: 0.5 mg/dL (ref 0.3–1.2)
Total Protein: 7.3 g/dL (ref 6.5–8.1)

## 2015-06-09 LAB — URINALYSIS, ROUTINE W REFLEX MICROSCOPIC
BILIRUBIN URINE: NEGATIVE
Glucose, UA: >1000 mg/dL — AB
HGB URINE DIPSTICK: NEGATIVE
KETONES UR: NEGATIVE mg/dL
Leukocytes, UA: NEGATIVE
NITRITE: NEGATIVE
PH: 6 (ref 5.0–8.0)
Protein, ur: NEGATIVE mg/dL
Specific Gravity, Urine: 1.024 (ref 1.005–1.030)

## 2015-06-09 LAB — URINE MICROSCOPIC-ADD ON
BACTERIA UA: NONE SEEN
RBC / HPF: NONE SEEN RBC/hpf (ref 0–5)

## 2015-06-09 LAB — LIPASE, BLOOD: Lipase: 36 U/L (ref 11–51)

## 2015-06-09 LAB — POC URINE PREG, ED: PREG TEST UR: NEGATIVE

## 2015-06-09 MED ORDER — SODIUM CHLORIDE 0.9 % IV BOLUS (SEPSIS): 1000.0000 mL | Freq: Once | INTRAVENOUS | Status: DC

## 2015-06-09 MED ORDER — ONDANSETRON HCL 4 MG/2ML IJ SOLN: 4.0000 mg | Freq: Once | INTRAMUSCULAR | Status: DC

## 2015-06-09 MED ORDER — KETOROLAC TROMETHAMINE 30 MG/ML IJ SOLN: 30.0000 mg | Freq: Once | INTRAMUSCULAR | Status: DC

## 2015-06-09 NOTE — ED Notes (Signed)
Pt. Refusing to wear blood pressure cuff and pulse ox.

## 2015-06-09 NOTE — ED Provider Notes (Signed)
CSN: [id number]     Arrival date & time 06/09/15  1504 History   First MD Initiated Contact with Patient 06/09/15 1527     Chief Complaint  Patient presents with  . Hyperglycemia     (Consider location/radiation/quality/duration/timing/severity/associated sxs/prior Treatment) HPI Jessica Tapia is a 29 y.o. female with history of type 1 diabetes, polysubstance abuse, presents to emergency department complaining of nausea, vomiting, elevated blood sugars, abdominal pain, back pain. Patient states symptoms started approximately 3-4 days ago. She states she is unable to keep anything down. She reports urinary frequency, no dysuria. No vaginal discharge or bleeding. States she's having mild cough, denies any sore throat or nasal congestion. She states she has been taking her insulin as prescribed, however she states every time she checks her blood sugar it reads "high." She has not tried to take any medications prior to coming in.  Past Medical History  Diagnosis Date  . Diabetes mellitus without complication [facility])    Past Surgical History  Procedure Laterality Date  . Amputation arm     No family history on file. Social History  Substance Use Topics  . Smoking status: Never Smoker   . Smokeless tobacco: None  . Alcohol Use: No   OB History    No data available     Review of Systems  Constitutional: Positive for activity change, appetite change and fatigue. Negative for fever and chills.  Respiratory: Negative for cough, chest tightness and shortness of breath.   Cardiovascular: Negative for chest pain, palpitations and leg swelling.  Gastrointestinal: Positive for nausea, vomiting and abdominal pain. Negative for diarrhea, constipation and blood in stool.  Genitourinary: Negative for dysuria, flank pain, vaginal bleeding, vaginal discharge, vaginal pain and pelvic pain.  Musculoskeletal: Positive for myalgias, back pain and arthralgias. Negative for neck pain and neck stiffness.   Skin: Negative for rash.  Neurological: Positive for weakness. Negative for dizziness and headaches.  All other systems reviewed and are negative.     Allergies  Review of patient's allergies indicates no known allergies.  Home Medications   Prior to Admission medications   Medication Sig Start Date End Date Taking? Authorizing Provider  ALPRAZolam Prudy Feeler) 0.5 MG tablet Take 0.5 mg by mouth 2 (two) times daily as needed. 03/15/15   Historical Provider, MD  Multiple Vitamin (MULTIVITAMIN WITH MINERALS) TABS tablet Take 1 tablet by mouth daily.    Historical Provider, MD  promethazine (PHENERGAN) 25 MG tablet Take 1 tablet (25 mg total) by mouth every 6 (six) hours as needed for nausea or vomiting. 05/31/15   Azalia Bilis, MD   BP 132/98 mmHg  Pulse 110  Temp(Src) 98.4 F (36.9 C) (Oral)  Resp 20  SpO2 98% Physical Exam  Constitutional: She is oriented to person, place, and time. She appears well-developed and well-nourished. No distress.  HENT:  Head: Normocephalic.  Eyes: Conjunctivae are normal.  Neck: Neck supple.  Cardiovascular: Normal rate, regular rhythm and normal heart sounds.   Pulmonary/Chest: Effort normal and breath sounds normal. No respiratory distress. She has no wheezes. She has no rales.  Abdominal: Soft. Bowel sounds are normal. She exhibits no distension. There is tenderness. There is no rebound.  Diffuse tenderness. Bilateral CVA tenderness  Musculoskeletal: She exhibits no edema.  Neurological: She is alert and oriented to person, place, and time.  Skin: Skin is warm and dry.  Psychiatric: She has a normal mood and affect. Her behavior is normal.  Nursing note and vitals reviewed.   ED  Course  Procedures (including critical care time) Labs Review Labs Reviewed  CBC - Abnormal; Notable for the following:    Hemoglobin 10.4 (*)    HCT 32.4 (*)    MCV 74.8 (*)    MCH 24.0 (*)    RDW 17.5 (*)    All other components within normal limits   URINALYSIS, ROUTINE W REFLEX MICROSCOPIC (NOT AT [facility]) - Abnormal; Notable for the following:    Glucose, UA >1000 (*)    All other components within normal limits  COMPREHENSIVE METABOLIC PANEL - Abnormal; Notable for the following:    Sodium 125 (*)    Chloride 94 (*)    CO2 21 (*)    Glucose, Bld 522 (*)    BUN 21 (*)    Albumin 3.3 (*)    Alkaline Phosphatase 244 (*)    All other components within normal limits  URINE MICROSCOPIC-ADD ON - Abnormal; Notable for the following:    Squamous Epithelial / LPF 0-5 (*)    All other components within normal limits  CBG MONITORING, ED - Abnormal; Notable for the following:    Glucose-Capillary 503 (*)    All other components within normal limits  LIPASE, BLOOD  BLOOD GAS, VENOUS  POC URINE PREG, ED    Imaging Review No results found. I have personally reviewed and evaluated these images and lab results as part of my medical decision-making.   EKG Interpretation None      MDM   Final diagnoses:  Hyperglycemia    Pt with type 1 DM, presents to ED with complaint of nausea, vomiting, abdominal pain, bilateral flank pain. Pt's blood sugar read "high" at home. Concerning for DKA. Will get labs, fluids started, will check ua. toradol and zofran ordered.    5:42 PM Pt refusing all treatment. She refused toradol and zofran. Pt requesting IV dilaudid, IV phenergan, IV benadryl. She states "nothing else works." She is requesting  IV out and watns to leave. She stated " I am not sick, I just need my pain controlled." i explained to her at bedside with RN present in the room, that I am worried that she may be in DKA, she needs fluids and insulin. Pt at this point crying and screaming saying "I need my purse, i need to get out of here now." Pt discharged AMA.   Filed Vitals:   06/09/15 1511 06/09/15 1515  BP: 126/85 132/98  Pulse: 110   Temp: 98.4 F (36.9 C)   TempSrc: Oral   Resp: 20   SpO2: 98%      Jaynie Crumbleatyana Aashrith Eves,  PA-C 06/09/15 2305  Mancel BaleElliott Wentz, MD 06/09/15 [phone]

## 2015-06-09 NOTE — ED Notes (Signed)
Pt presents via [facility] from home with c/o high blood sugar.  Pt CBG reading high.  Pt hx: IV drug abuse, Type 1 DM. Pt reports increase urination and thirst.  Pt a x 4, continues to repeat "i need ice water", "I cant talk to you until I have ice water".  Pt a x 4, NAD.  Bp-140/90 P-120

## 2015-06-09 NOTE — ED Notes (Signed)
Pt states that she requires US IV.

## 2015-06-09 NOTE — ED Notes (Signed)
Pt refusing meds and treatment. States her wishes to leave. Pa aware. Pt ambulatory and in nad.

## 2015-06-09 NOTE — ED Notes (Signed)
Pt. Refusing any tests or treatments at this time until EDP sees her.

## 2015-08-17 ENCOUNTER — Inpatient Hospital Stay (HOSPITAL_COMMUNITY)
Admission: EM | Admit: 2015-08-17 | Discharge: 2015-08-23 | DRG: 637 | Discharge status: Against Medical Advice | Payer: Self-pay | Attending: Internal Medicine | Admitting: Internal Medicine

## 2015-08-17 ENCOUNTER — Emergency Department (HOSPITAL_COMMUNITY): Payer: Self-pay

## 2015-08-17 ENCOUNTER — Encounter (HOSPITAL_COMMUNITY): Payer: Self-pay | Admitting: Internal Medicine

## 2015-08-17 DIAGNOSIS — R197 Diarrhea, unspecified: Secondary | ICD-10-CM | POA: Diagnosis present

## 2015-08-17 DIAGNOSIS — R251 Tremor, unspecified: Secondary | ICD-10-CM | POA: Diagnosis present

## 2015-08-17 DIAGNOSIS — N760 Acute vaginitis: Secondary | ICD-10-CM

## 2015-08-17 DIAGNOSIS — E876 Hypokalemia: Secondary | ICD-10-CM | POA: Diagnosis not present

## 2015-08-17 DIAGNOSIS — J189 Pneumonia, unspecified organism: Secondary | ICD-10-CM | POA: Diagnosis present

## 2015-08-17 DIAGNOSIS — Z833 Family history of diabetes mellitus: Secondary | ICD-10-CM

## 2015-08-17 DIAGNOSIS — R109 Unspecified abdominal pain: Secondary | ICD-10-CM | POA: Diagnosis present

## 2015-08-17 DIAGNOSIS — E1010 Type 1 diabetes mellitus with ketoacidosis without coma: Principal | ICD-10-CM | POA: Diagnosis present

## 2015-08-17 DIAGNOSIS — E0500 Thyrotoxicosis with diffuse goiter without thyrotoxic crisis or storm: Secondary | ICD-10-CM | POA: Diagnosis present

## 2015-08-17 DIAGNOSIS — E109 Type 1 diabetes mellitus without complications: Secondary | ICD-10-CM | POA: Diagnosis present

## 2015-08-17 DIAGNOSIS — R112 Nausea with vomiting, unspecified: Secondary | ICD-10-CM | POA: Diagnosis present

## 2015-08-17 DIAGNOSIS — R000 Tachycardia, unspecified: Secondary | ICD-10-CM | POA: Diagnosis present

## 2015-08-17 DIAGNOSIS — Z89222 Acquired absence of left upper limb above elbow: Secondary | ICD-10-CM

## 2015-08-17 DIAGNOSIS — D509 Iron deficiency anemia, unspecified: Secondary | ICD-10-CM | POA: Diagnosis present

## 2015-08-17 DIAGNOSIS — R079 Chest pain, unspecified: Secondary | ICD-10-CM

## 2015-08-17 DIAGNOSIS — R059 Cough, unspecified: Secondary | ICD-10-CM

## 2015-08-17 DIAGNOSIS — Z882 Allergy status to sulfonamides status: Secondary | ICD-10-CM

## 2015-08-17 DIAGNOSIS — N390 Urinary tract infection, site not specified: Secondary | ICD-10-CM | POA: Diagnosis present

## 2015-08-17 DIAGNOSIS — N739 Female pelvic inflammatory disease, unspecified: Secondary | ICD-10-CM | POA: Diagnosis present

## 2015-08-17 DIAGNOSIS — E010 Iodine-deficiency related diffuse (endemic) goiter: Secondary | ICD-10-CM | POA: Diagnosis present

## 2015-08-17 DIAGNOSIS — R102 Pelvic and perineal pain unspecified side: Secondary | ICD-10-CM | POA: Diagnosis present

## 2015-08-17 DIAGNOSIS — E1110 Type 2 diabetes mellitus with ketoacidosis without coma: Secondary | ICD-10-CM | POA: Diagnosis present

## 2015-08-17 DIAGNOSIS — R1084 Generalized abdominal pain: Secondary | ICD-10-CM

## 2015-08-17 DIAGNOSIS — Z22322 Carrier or suspected carrier of Methicillin resistant Staphylococcus aureus: Secondary | ICD-10-CM

## 2015-08-17 DIAGNOSIS — E1310 Other specified diabetes mellitus with ketoacidosis without coma: Secondary | ICD-10-CM

## 2015-08-17 DIAGNOSIS — I10 Essential (primary) hypertension: Secondary | ICD-10-CM | POA: Diagnosis present

## 2015-08-17 DIAGNOSIS — R7989 Other specified abnormal findings of blood chemistry: Secondary | ICD-10-CM | POA: Diagnosis present

## 2015-08-17 DIAGNOSIS — Z794 Long term (current) use of insulin: Secondary | ICD-10-CM

## 2015-08-17 DIAGNOSIS — R05 Cough: Secondary | ICD-10-CM

## 2015-08-17 DIAGNOSIS — F419 Anxiety disorder, unspecified: Secondary | ICD-10-CM | POA: Diagnosis present

## 2015-08-17 LAB — COMPREHENSIVE METABOLIC PANEL
ALBUMIN: 3.3 g/dL — AB (ref 3.5–5.0)
ALK PHOS: 203 U/L — AB (ref 38–126)
ALT: 37 U/L (ref 14–54)
AST: 115 U/L — AB (ref 15–41)
Anion gap: 20 — ABNORMAL HIGH (ref 5–15)
BILIRUBIN TOTAL: 1.3 mg/dL — AB (ref 0.3–1.2)
BUN: 38 mg/dL — AB (ref 6–20)
CALCIUM: 8.9 mg/dL (ref 8.9–10.3)
CO2: 10 mmol/L — ABNORMAL LOW (ref 22–32)
CREATININE: 0.98 mg/dL (ref 0.44–1.00)
Chloride: 97 mmol/L — ABNORMAL LOW (ref 101–111)
GFR calc Af Amer: >60 mL/min (ref >60)
GFR calc non Af Amer: >60 mL/min (ref >60)
GLUCOSE: 591 mg/dL — AB (ref 65–99)
Potassium: 5.7 mmol/L — ABNORMAL HIGH (ref 3.5–5.1)
Sodium: 127 mmol/L — ABNORMAL LOW (ref 135–145)
TOTAL PROTEIN: 7.1 g/dL (ref 6.5–8.1)

## 2015-08-17 LAB — CBC WITH DIFFERENTIAL/PLATELET
BASOS ABS: 0.1 10*3/uL (ref 0.0–0.1)
BASOS PCT: 1 %
EOS ABS: 0.1 10*3/uL (ref 0.0–0.7)
EOS PCT: 2 %
HCT: 38 % (ref 36.0–46.0)
Hemoglobin: 11.5 g/dL — ABNORMAL LOW (ref 12.0–15.0)
LYMPHS PCT: 32 %
Lymphs Abs: 2.4 10*3/uL (ref 0.7–4.0)
MCH: 22.7 pg — ABNORMAL LOW (ref 26.0–34.0)
MCHC: 30.3 g/dL (ref 30.0–36.0)
MCV: 75 fL — AB (ref 78.0–100.0)
MONO ABS: 0.4 10*3/uL (ref 0.1–1.0)
Monocytes Relative: 6 %
Neutro Abs: 4.4 10*3/uL (ref 1.7–7.7)
Neutrophils Relative %: 60 %
PLATELETS: 187 10*3/uL (ref 150–400)
RBC: 5.07 MIL/uL (ref 3.87–5.11)
RDW: 16.7 % — AB (ref 11.5–15.5)
WBC: 7.4 10*3/uL (ref 4.0–10.5)

## 2015-08-17 LAB — BLOOD GAS, VENOUS
Acid-base deficit: 17 mmol/L — ABNORMAL HIGH (ref 0.0–2.0)
BICARBONATE: 9.7 meq/L — AB (ref 20.0–24.0)
FIO2: 0.21
O2 SAT: 74.3 %
PATIENT TEMPERATURE: 98
PH VEN: 7.199 — AB (ref 7.250–7.300)
TCO2: 9.3 mmol/L (ref 0–100)
pCO2, Ven: 25.8 mmHg — ABNORMAL LOW (ref 45.0–50.0)
pO2, Ven: 50.8 mmHg — ABNORMAL HIGH (ref 30.0–45.0)

## 2015-08-17 LAB — URINALYSIS, ROUTINE W REFLEX MICROSCOPIC
Nitrite: NEGATIVE
PROTEIN: NEGATIVE mg/dL
Specific Gravity, Urine: 1.03 (ref 1.005–1.030)
pH: 5 (ref 5.0–8.0)

## 2015-08-17 LAB — URINE MICROSCOPIC-ADD ON

## 2015-08-17 LAB — CBG MONITORING, ED: Glucose-Capillary: 428 mg/dL — ABNORMAL HIGH (ref 65–99)

## 2015-08-17 MED ORDER — SODIUM CHLORIDE 0.9 % IV SOLN
INTRAVENOUS | Status: DC
  Administered 2015-08-17: 5.4 [IU]/h via INTRAVENOUS
  Filled 2015-08-17: qty 2.5

## 2015-08-17 MED ORDER — LORAZEPAM 2 MG/ML IJ SOLN
0.5000 mg | Freq: Once | INTRAMUSCULAR | Status: AC
  Administered 2015-08-17: 0.5 mg via INTRAVENOUS

## 2015-08-17 MED ORDER — SODIUM CHLORIDE 0.9 % IV SOLN: INTRAVENOUS | Status: DC

## 2015-08-17 MED ORDER — SODIUM CHLORIDE 0.9 % IV BOLUS (SEPSIS): 2000.0000 mL | Freq: Once | INTRAVENOUS | Status: DC

## 2015-08-17 MED ORDER — DEXTROSE-NACL 5-0.45 % IV SOLN: INTRAVENOUS | Status: DC

## 2015-08-17 MED ORDER — SODIUM CHLORIDE 0.9 % IV BOLUS (SEPSIS)
1000.0000 mL | Freq: Once | INTRAVENOUS | Status: AC
Start: 2015-08-17 — End: 2015-08-18
  Administered 2015-08-18: 1000 mL via INTRAVENOUS

## 2015-08-17 MED ORDER — HYDROMORPHONE HCL 1 MG/ML IJ SOLN
0.5000 mg | Freq: Once | INTRAMUSCULAR | Status: AC
  Administered 2015-08-17: 0.5 mg via INTRAVENOUS
  Filled 2015-08-17: qty 1

## 2015-08-17 MED ORDER — ONDANSETRON HCL 4 MG/2ML IJ SOLN
4.0000 mg | Freq: Once | INTRAMUSCULAR | Status: AC
  Administered 2015-08-17: 4 mg via INTRAVENOUS
  Filled 2015-08-17: qty 2

## 2015-08-17 MED ORDER — PROMETHAZINE HCL 25 MG/ML IJ SOLN
12.5000 mg | Freq: Once | INTRAMUSCULAR | Status: AC
  Administered 2015-08-17: 12.5 mg via INTRAVENOUS
  Filled 2015-08-17: qty 1

## 2015-08-17 MED ORDER — DIPHENHYDRAMINE HCL 50 MG/ML IJ SOLN
12.5000 mg | Freq: Once | INTRAMUSCULAR | Status: AC
  Administered 2015-08-17: 12.5 mg via INTRAVENOUS

## 2015-08-17 MED ORDER — DIPHENHYDRAMINE HCL 50 MG/ML IJ SOLN
INTRAMUSCULAR | Status: AC
  Administered 2015-08-17: 12.5 mg via INTRAVENOUS
  Filled 2015-08-17: qty 1

## 2015-08-17 MED ORDER — MORPHINE SULFATE (PF) 4 MG/ML IV SOLN
4.0000 mg | Freq: Once | INTRAVENOUS | Status: AC
  Administered 2015-08-17: 4 mg via INTRAVENOUS
  Filled 2015-08-17: qty 1

## 2015-08-17 MED ORDER — METOCLOPRAMIDE HCL 5 MG/ML IJ SOLN
5.0000 mg | Freq: Once | INTRAMUSCULAR | Status: AC
  Administered 2015-08-17: 5 mg via INTRAVENOUS
  Filled 2015-08-17: qty 2

## 2015-08-17 MED ORDER — LORAZEPAM 2 MG/ML IJ SOLN
INTRAMUSCULAR | Status: AC
  Administered 2015-08-17: 0.5 mg via INTRAVENOUS
  Filled 2015-08-17: qty 1

## 2015-08-17 MED ORDER — SODIUM CHLORIDE 0.9 % IV BOLUS (SEPSIS)
1000.0000 mL | Freq: Once | INTRAVENOUS | Status: AC
  Administered 2015-08-18: 1000 mL via INTRAVENOUS

## 2015-08-17 MED ORDER — SODIUM CHLORIDE 0.9 % IV BOLUS (SEPSIS)
2000.0000 mL | Freq: Once | INTRAVENOUS | Status: AC
  Administered 2015-08-17: 2000 mL via INTRAVENOUS

## 2015-08-17 MED ORDER — HYDROMORPHONE HCL 1 MG/ML IJ SOLN
0.5000 mg | Freq: Once | INTRAMUSCULAR | Status: AC
Start: 2015-08-17 — End: 2015-08-17
  Administered 2015-08-17: 0.5 mg via INTRAVENOUS
  Filled 2015-08-17: qty 1

## 2015-08-17 NOTE — ED Notes (Signed)
Glucose 591 reported to MD and RN.

## 2015-08-17 NOTE — ED Notes (Signed)
Unsuccessful lab draw by this writer. RN made aware. 

## 2015-08-17 NOTE — H&P (Signed)
[facility] History and Physical  Camauri Fleece DGL:[id number] DOB: 1986/05/23 DOA: 08/17/2015   PCP: Patient does not have a PCP. She recently relocated back to this area Specialists: None  Chief Complaint: Nausea, vomiting, abdominal pain for 3 days along with diarrhea  HPI: Jessica Tapia is a 30 y.o. female with a past medical history of diabetes mellitus diagnosed about 4 years ago and she denies any other health problems. She is a very poor historian. Patient is mumbling and groaning. She complains of nausea and abdominal pain. She refuses to answer questions. She did tell me that she has not missed taking any of her insulin doses. She tells me that she is on Lantus and takes 22 units in the morning and 24 units at night. She tells me that about 3 days ago she started having nausea, vomiting, followed by abdominal pain. She's also had diarrhea 2-3 times a day with some blood in the stool. She was not able to provide any further details. She denies any fever or chills. History is extremely limited.  In the emergency department, blood work revealed diabetic ketoacidosis. Patient also is noted to have an abnormal UA. She'll be hospitalized for further management.  Home Medications: Prior to Admission medications   Medication Sig Start Date End Date Taking? Authorizing Provider  insulin aspart (NOVOLOG) 100 UNIT/ML injection Inject 1-10 Units into the skin 3 (three) times daily before meals. Per sliding scale   Yes Historical Provider, MD  insulin detemir (LEVEMIR) 100 UNIT/ML injection Inject 12 Units into the skin 2 (two) times daily.   Yes Historical Provider, MD  promethazine (PHENERGAN) 25 MG tablet Take 1 tablet (25 mg total) by mouth every 6 (six) hours as needed for nausea or vomiting. Patient not taking: Reported on 08/17/2015 05/31/15   Jola Schmidt, MD    Allergies:  Allergies  Allergen Reactions  . Sulfa Antibiotics     Reaction: unknown     Past Medical History: Past  Medical History  Diagnosis Date  . Diabetes mellitus without complication [facility])     Past Surgical History  Procedure Laterality Date  . Amputation arm      Social History: Currently is living with her sister. Denies any smoking, alcohol use or illicit drug use.  Family History: She tells me that there is history of diabetes in the family. Otherwise, this not specified any further.  Review of Systems - unable to do as the patient is uncooperative  Physical Examination  Filed Vitals:   08/17/15 1929 08/17/15 2042  BP: 110/76   Pulse: 135   Temp: 98 F (36.7 C) 98.5 F (36.9 C)  TempSrc: Oral Rectal  Resp: 22   SpO2: 96%     BP 110/76 mmHg  Pulse 135  Temp(Src) 98.5 F (36.9 C) (Rectal)  Resp 22  SpO2 96%  General appearance: alert, appears stated age, distracted, no distress and uncooperative Head: Normocephalic, without obvious abnormality, atraumatic Eyes: conjunctivae/corneas clear. PERRL, EOM's intact.  Throat: Dry mucous membranes Neck: no adenopathy, no carotid bruit, no JVD, supple, symmetrical, trachea midline and thyroid not enlarged, symmetric, no tenderness/mass/nodules Resp: clear to auscultation bilaterally Cardio: S1, S2 is tachycardic. Regular. No S3, S4. No rubs, murmurs or bruit. No pedal edema GI: Abdomen is soft. Mildly tender in the epigastric area without any rebound, rigidity or guarding. No obvious masses or organomegaly. Extremities: She is status post left forearm amputation Pulses: 2+ and symmetric Skin: Skin color, texture, turgor normal. No rashes or lesions Lymph  nodes: Cervical, supraclavicular, and axillary nodes normal. Neurologic: Alert and oriented 3. No focal neurological deficits.  Laboratory Data: Results for orders placed or performed during the hospital encounter of 08/17/15 (from the past 48 hour(s))  CBG monitoring, ED     Status: Abnormal   Collection Time: 08/17/15  7:26 PM  Result Value Ref Range   Glucose-Capillary  428 (H) 65 - 99 mg/dL  Comprehensive metabolic panel     Status: Abnormal   Collection Time: 08/17/15  9:28 PM  Result Value Ref Range   Sodium 127 (L) 135 - 145 mmol/L   Potassium 5.7 (H) 3.5 - 5.1 mmol/L   Chloride 97 (L) 101 - 111 mmol/L   CO2 10 (L) 22 - 32 mmol/L   Glucose, Bld 591 (HH) 65 - 99 mg/dL    Comment: CRITICAL RESULT CALLED TO, READ BACK BY AND VERIFIED WITH: Candiss Norse RN 2201 08/17/15 A NAVARRO    BUN 38 (H) 6 - 20 mg/dL   Creatinine, Ser 0.98 0.44 - 1.00 mg/dL   Calcium 8.9 8.9 - 10.3 mg/dL   Total Protein 7.1 6.5 - 8.1 g/dL   Albumin 3.3 (L) 3.5 - 5.0 g/dL   AST 115 (H) 15 - 41 U/L   ALT 37 14 - 54 U/L   Alkaline Phosphatase 203 (H) 38 - 126 U/L   Total Bilirubin 1.3 (H) 0.3 - 1.2 mg/dL   GFR calc non Af Amer >60 >60 mL/min   GFR calc Af Amer >60 >60 mL/min    Comment: (NOTE) The eGFR has been calculated using the CKD EPI equation. This calculation has not been validated in all clinical situations. eGFR's persistently <60 mL/min signify possible Chronic Kidney Disease.    Anion gap 20 (H) 5 - 15  CBC with Differential/Platelet     Status: Abnormal   Collection Time: 08/17/15  9:28 PM  Result Value Ref Range   WBC 7.4 4.0 - 10.5 K/uL   RBC 5.07 3.87 - 5.11 MIL/uL   Hemoglobin 11.5 (L) 12.0 - 15.0 g/dL   HCT 38.0 36.0 - 46.0 %   MCV 75.0 (L) 78.0 - 100.0 fL   MCH 22.7 (L) 26.0 - 34.0 pg   MCHC 30.3 30.0 - 36.0 g/dL   RDW 16.7 (H) 11.5 - 15.5 %   Platelets 187 150 - 400 K/uL   Neutrophils Relative % 60 %   Neutro Abs 4.4 1.7 - 7.7 K/uL   Lymphocytes Relative 32 %   Lymphs Abs 2.4 0.7 - 4.0 K/uL   Monocytes Relative 6 %   Monocytes Absolute 0.4 0.1 - 1.0 K/uL   Eosinophils Relative 2 %   Eosinophils Absolute 0.1 0.0 - 0.7 K/uL   Basophils Relative 1 %   Basophils Absolute 0.1 0.0 - 0.1 K/uL  Blood gas, venous     Status: Abnormal   Collection Time: 08/17/15  9:38 PM  Result Value Ref Range   FIO2 0.21    Delivery systems ROOM AIR    pH, Ven  7.199 (LL) 7.250 - 7.300    Comment: CRITICAL RESULT CALLED TO, READ BACK BY AND VERIFIED WITH:  Rayetta Pigg, RN AT 2147 BY JESSICA NEUGENT,RRT,RCP ON 08/17/15    pCO2, Ven 25.8 (L) 45.0 - 50.0 mmHg   pO2, Ven 50.8 (H) 30.0 - 45.0 mmHg   Bicarbonate 9.7 (L) 20.0 - 24.0 mEq/L   TCO2 9.3 0 - 100 mmol/L   Acid-base deficit 17.0 (H) 0.0 - 2.0 mmol/L   O2  Saturation 74.3 %   Patient temperature 98.0    Collection site VEIN    Drawn by DRAWN BY RN    Sample type VENOUS   Urinalysis, Routine w reflex microscopic (not at [facility])     Status: Abnormal   Collection Time: 08/17/15 10:02 PM  Result Value Ref Range   Color, Urine YELLOW YELLOW   APPearance CLOUDY (A) CLEAR   Specific Gravity, Urine 1.030 1.005 - 1.030   pH 5.0 5.0 - 8.0   Glucose, UA >1000 (A) NEGATIVE mg/dL   Hgb urine dipstick SMALL (A) NEGATIVE   Bilirubin Urine MODERATE (A) NEGATIVE   Ketones, ur >80 (A) NEGATIVE mg/dL   Protein, ur NEGATIVE NEGATIVE mg/dL   Nitrite NEGATIVE NEGATIVE   Leukocytes, UA SMALL (A) NEGATIVE  Urine microscopic-add on     Status: Abnormal   Collection Time: 08/17/15 10:02 PM  Result Value Ref Range   Squamous Epithelial / LPF 6-30 (A) NONE SEEN   WBC, UA 6-30 0 - 5 WBC/hpf   RBC / HPF 6-30 0 - 5 RBC/hpf   Bacteria, UA MANY (A) NONE SEEN   Urine-Other YEAST PRESENT     Radiology Reports: Dg Chest Port 1 View  08/17/2015  CLINICAL DATA:  Diabetic ketoacidosis.  Cough. EXAM: PORTABLE CHEST 1 VIEW COMPARISON:  None. FINDINGS: Cardiac silhouette is normal in size and configuration. Normal mediastinal and hilar contours. There are irregularly thickened bronchovascular/interstitial markings which predominate centrally. No lung consolidation is seen to suggest pneumonia. No pleural effusion or pneumothorax. Skeletal structures are unremarkable. IMPRESSION: 1. Bilateral thickened bronchovascular/interstitial markings. This could be due to diffuse bronchitis. Findings may reflect mild interstitial  edema. No evidence of lobar pneumonia. Electronically Signed   By: Lajean Manes M.D.   On: 08/17/2015 22:22     Problem List  Principal Problem:   DKA (diabetic ketoacidoses) ([place]) Active Problems:   Diarrhea   Nausea and vomiting   Abdominal pain   Assessment: This is a 30 year old Caucasian female with a past medical history of diabetes, comes in with nausea, vomiting, diarrhea and abdominal pain and is found to have diabetic ketoacidosis. She has an abnormal UA, which could be suggestive of a UTI. She also has mildly elevated AST and bilirubin.  Plan: #1 Diabetic ketoacidosis: She'll be hospitalized and placed on insulin infusion. She'll be given IV fluids per protocol. Metabolic panels will be repeated every few hours. UTI could've precipitated this but it's unclear. Her compliance is questionable. We'll check HbA1c. Diabetes coordinator to see patient as well. She will also need a primary care provider when she is discharged.  #2 nausea, vomiting and abdominal pain: Symptoms are most likely secondary to DKA. UTI could be contributing as well. No rebound, rigidity appreciated on examination. She had a CT scan of her abdomen pelvis done in December which did not show any acute or concerning findings. Treat DKA and monitor clinically for now. PPI.  #3 Diarrhea: Check stool studies for C. Difficile. Could be related to her diabetes.  #4 Abnormal UA: Possible UTI, although not conclusive. Treat empirically with ceftriaxone for now. Await cultures.  #5. Mildly elevated AST, alkaline phosphatase and bilirubin: This will be repeated tomorrow morning.   DVT Prophylaxis: Lovenox Code Status: Full code Family Communication: Discussed with the patient  Disposition Plan: Admit to stepdown   Further management decisions will depend on results of further testing and patient's response to treatment.   [facility] Pager [phone]  If 7PM-7AM, please  contact  night-coverage [email or web address] Password [facility]  08/17/2015, 10:51 PM

## 2015-08-17 NOTE — ED Notes (Signed)
Pt BIB EMS. Complaining of high blood sugar. EMS initial reading of 430. Complaining of abdominal pain and diarrhea x3 days. A&Ox 4 and ambulatory upon arrival.  BP 100/74, HR 142, RR 24, 98% RA

## 2015-08-17 NOTE — ED Notes (Signed)
Blood successfully drawn by MD.

## 2015-08-17 NOTE — ED Notes (Signed)
Spoke with Press photographer. Will take patient up in 20 mins.

## 2015-08-17 NOTE — ED Notes (Signed)
Pt uncooperative with blood draw. MD made aware.

## 2015-08-17 NOTE — ED Notes (Addendum)
Pt speech is slurred, but pt is requesting to see the doctor.  Yelling out thru the door.  Writer informed pt that there are others here who are sick as well and ask her to please stop yelling.  Pt sts that she is in pain.  Explained to the pt that she received pain meds already and although she is requesting to see a doctor, he is unable to come to the bedside at this time.  Pt is refusing all procedures of care until she see's the doctor.

## 2015-08-17 NOTE — ED Notes (Signed)
Bed: ZO10 Expected date:  Expected time:  Means of arrival:  Comments: EMS- 29yo F, hyperglycemia/diarrhea

## 2015-08-17 NOTE — ED Notes (Signed)
Hospitalist at bedside 

## 2015-08-17 NOTE — ED Provider Notes (Signed)
CSN: [id number]     Arrival date & time 08/17/15  1907 History   First MD Initiated Contact with Patient 08/17/15 1924     Chief Complaint  Patient presents with  . Hyperglycemia   Level V caveat unstable vital signs  (Consider location/radiation/quality/duration/timing/severity/associated sxs/prior Treatment) HPI Complains of vomiting diarrhea cough and shortness of breath onset 3 days ago. No known fever. Also complains of epigastric pain and chest pain, worse with coughing. Not improved by anything. No other associated symptoms. No treatment prior to coming here. Denies noncompliance with medications. Past Medical History  Diagnosis Date  . Diabetes mellitus without complication [facility])    Past Surgical History  Procedure Laterality Date  . Amputation arm     No family history on file. Social History  Substance Use Topics  . Smoking status: Never Smoker   . Smokeless tobacco: Not on file  . Alcohol Use: No   OB History    No data available     Review of Systems  Respiratory: Positive for cough.   Cardiovascular: Positive for chest pain.  Gastrointestinal: Positive for vomiting, abdominal pain and diarrhea.       Gastric pain  Allergic/Immunologic: Positive for immunocompromised state.       Diabetic  All other systems reviewed and are negative.     Allergies  Review of patient's allergies indicates no known allergies.  Home Medications   Prior to Admission medications   Medication Sig Start Date End Date Taking? Authorizing Provider  ALPRAZolam Prudy Feeler) 0.5 MG tablet Take 0.5 mg by mouth 2 (two) times daily as needed. 03/15/15   Historical Provider, MD  Multiple Vitamin (MULTIVITAMIN WITH MINERALS) TABS tablet Take 1 tablet by mouth daily.    Historical Provider, MD  promethazine (PHENERGAN) 25 MG tablet Take 1 tablet (25 mg total) by mouth every 6 (six) hours as needed for nausea or vomiting. 05/31/15   Azalia Bilis, MD   BP 110/76 mmHg  Pulse 135  Temp(Src) 98  F (36.7 C) (Oral)  Resp 22  SpO2 96% Physical Exam  Constitutional:  Ill appearing, chronically and acutely. Cachectic  HENT:  Head: Normocephalic and atraumatic.  Mucous membranes dry  Eyes: Conjunctivae are normal. Pupils are equal, round, and reactive to light.  Neck: Neck supple. No tracheal deviation present. No thyromegaly present.  Cardiovascular: Normal rate and regular rhythm.   No murmur heard. Pulmonary/Chest: Effort normal and breath sounds normal. She exhibits tenderness.  Chest tender anteriorly  Abdominal: Soft. Bowel sounds are normal. She exhibits no distension. There is tenderness. There is no rebound and no guarding.  Mild epigastric tenderness  Musculoskeletal: Normal range of motion. She exhibits no edema or tenderness.  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 All other extremities well redness or tenderness neurovascularly intact  Neurological: She is alert. Coordination normal.  Skin: Skin is warm and dry. No rash noted.  Psychiatric: She has a normal mood and affect.  Nursing note and vitals reviewed.   ED Course  Procedures (including critical care time) Labs Review Labs Reviewed  CBG MONITORING, ED - Abnormal; Notable for the following:    Glucose-Capillary 428 (*)    All other components within normal limits  COMPREHENSIVE METABOLIC PANEL  CBC WITH DIFFERENTIAL/PLATELET  URINALYSIS, ROUTINE W REFLEX MICROSCOPIC (NOT AT [facility])  LIPASE, BLOOD    Imaging Review No results found. I have personally reviewed and evaluated these images and lab results as part of my medical decision-making.   EKG Interpretation  None     1935 p.m. intravenous morphine and intravenous Reglan ordered for pain and nausea as well as intravenous saline bolus, 2 L. 2100 hours patient complained of extreme itching after morphine and extreme anxiety. Intravenous Ativan and Benadryl ordered. 2 1:20 PM she remained nauseated. Zofran ordered intravenously. 21:50 PM  she complains of diffuse body aches. Intravenous hydromorphone ordered. At 2200 hrs. she feels improved. She is less anxious. And she states her pain is diminished.  Nursing was unable to obtain blood from patient. I performed phlebotomy. Procedure. Timeout performed right groin was prepped with chlorhexidine. I obtained blood using 21-gauge needle with syringe easily from right femoral vein. Nonpulsatile dark blood. No resultant hematoma  Results for orders placed or performed during the hospital encounter of 08/17/15  Comprehensive metabolic panel  Result Value Ref Range   Sodium 127 (L) 135 - 145 mmol/L   Potassium 5.7 (H) 3.5 - 5.1 mmol/L   Chloride 97 (L) 101 - 111 mmol/L   CO2 10 (L) 22 - 32 mmol/L   Glucose, Bld 591 (HH) 65 - 99 mg/dL   BUN 38 (H) 6 - 20 mg/dL   Creatinine, Ser 1.61 0.44 - 1.00 mg/dL   Calcium 8.9 8.9 - 09.6 mg/dL   Total Protein 7.1 6.5 - 8.1 g/dL   Albumin 3.3 (L) 3.5 - 5.0 g/dL   AST 045 (H) 15 - 41 U/L   ALT 37 14 - 54 U/L   Alkaline Phosphatase 203 (H) 38 - 126 U/L   Total Bilirubin 1.3 (H) 0.3 - 1.2 mg/dL   GFR calc non Af Amer >60 >60 mL/min   GFR calc Af Amer >60 >60 mL/min   Anion gap 20 (H) 5 - 15  CBC with Differential/Platelet  Result Value Ref Range   WBC 7.4 4.0 - 10.5 K/uL   RBC 5.07 3.87 - 5.11 MIL/uL   Hemoglobin 11.5 (L) 12.0 - 15.0 g/dL   HCT 40.9 81.1 - 91.4 %   MCV 75.0 (L) 78.0 - 100.0 fL   MCH 22.7 (L) 26.0 - 34.0 pg   MCHC 30.3 30.0 - 36.0 g/dL   RDW 78.2 (H) 95.6 - 21.3 %   Platelets 187 150 - 400 K/uL   Neutrophils Relative % 60 %   Neutro Abs 4.4 1.7 - 7.7 K/uL   Lymphocytes Relative 32 %   Lymphs Abs 2.4 0.7 - 4.0 K/uL   Monocytes Relative 6 %   Monocytes Absolute 0.4 0.1 - 1.0 K/uL   Eosinophils Relative 2 %   Eosinophils Absolute 0.1 0.0 - 0.7 K/uL   Basophils Relative 1 %   Basophils Absolute 0.1 0.0 - 0.1 K/uL  Urinalysis, Routine w reflex microscopic (not at [facility])  Result Value Ref Range   Color, Urine YELLOW  YELLOW   APPearance CLOUDY (A) CLEAR   Specific Gravity, Urine 1.030 1.005 - 1.030   pH 5.0 5.0 - 8.0   Glucose, UA >1000 (A) NEGATIVE mg/dL   Hgb urine dipstick SMALL (A) NEGATIVE   Bilirubin Urine MODERATE (A) NEGATIVE   Ketones, ur >80 (A) NEGATIVE mg/dL   Protein, ur NEGATIVE NEGATIVE mg/dL   Nitrite NEGATIVE NEGATIVE   Leukocytes, UA SMALL (A) NEGATIVE  Blood gas, venous  Result Value Ref Range   FIO2 0.21    Delivery systems ROOM AIR    pH, Ven 7.199 (LL) 7.250 - 7.300   pCO2, Ven 25.8 (L) 45.0 - 50.0 mmHg   pO2, Ven 50.8 (H) 30.0 - 45.0  mmHg   Bicarbonate 9.7 (L) 20.0 - 24.0 mEq/L   TCO2 9.3 0 - 100 mmol/L   Acid-base deficit 17.0 (H) 0.0 - 2.0 mmol/L   O2 Saturation 74.3 %   Patient temperature 98.0    Collection site VEIN    Drawn by DRAWN BY RN    Sample type VENOUS   Urine microscopic-add on  Result Value Ref Range   Squamous Epithelial / LPF 6-30 (A) NONE SEEN   WBC, UA 6-30 0 - 5 WBC/hpf   RBC / HPF 6-30 0 - 5 RBC/hpf   Bacteria, UA MANY (A) NONE SEEN   Urine-Other YEAST PRESENT   CBG monitoring, ED  Result Value Ref Range   Glucose-Capillary 428 (H) 65 - 99 mg/dL   Dg Chest Port 1 View  08/17/2015  CLINICAL DATA:  Diabetic ketoacidosis.  Cough. EXAM: PORTABLE CHEST 1 VIEW COMPARISON:  None. FINDINGS: Cardiac silhouette is normal in size and configuration. Normal mediastinal and hilar contours. There are irregularly thickened bronchovascular/interstitial markings which predominate centrally. No lung consolidation is seen to suggest pneumonia. No pleural effusion or pneumothorax. Skeletal structures are unremarkable. IMPRESSION: 1. Bilateral thickened bronchovascular/interstitial markings. This could be due to diffuse bronchitis. Findings may reflect mild interstitial edema. No evidence of lobar pneumonia. Electronically Signed   By: Amie Portland M.D.   On: 08/17/2015 22:22    MDM  Patient noted to be in diabetic ketoacidosis. Insulin drip and DKA order set  ordered after complete   metabolic panel resulted showing potassium of 5.7.  Dr.G. Rito Ehrlich was consulted and evaluated patient in the emergency department. She will be admitted to stepdown unit . urine sent for culture  Patient likely has viral illness with cough, vomiting, diarrhea. She is also likely been underdosed with insulin. Her sister has chosen her insulin regimen for her and she's had no medical supervision in taking insulin. She appeared severely dehydrated on initial presentation. Final diagnoses:  None  Dx #1 DKA #2severe dehydration #3nausea, vomiting and diarrhea #4acute bronchitis #5 tobacco abuse CRITICAL CARE Performed by: Doug Sou Total critical care time: 60 minutes Critical care time was exclusive of separately billable procedures and treating other patients. Critical care was necessary to treat or prevent imminent or life-threatening deterioration. Critical care was time spent personally by me on the following activities: development of treatment plan with patient and/or surrogate as well as nursing, discussions with consultants, evaluation of patient's response to treatment, examination of patient, obtaining history from patient or surrogate, ordering and performing treatments and interventions, ordering and review of laboratory studies, ordering and review of radiographic studies, pulse oximetry and re-evaluation of patient's condition.     Doug Sou, MD 08/17/15 [phone]

## 2015-08-17 NOTE — ED Notes (Signed)
PT screaming and yelling out. She has been given pain medicine and nausea medicine.

## 2015-08-18 DIAGNOSIS — N390 Urinary tract infection, site not specified: Secondary | ICD-10-CM | POA: Diagnosis present

## 2015-08-18 DIAGNOSIS — J189 Pneumonia, unspecified organism: Secondary | ICD-10-CM | POA: Diagnosis present

## 2015-08-18 DIAGNOSIS — Z22322 Carrier or suspected carrier of Methicillin resistant Staphylococcus aureus: Secondary | ICD-10-CM

## 2015-08-18 LAB — CBC
HEMATOCRIT: 32.8 % — AB (ref 36.0–46.0)
Hemoglobin: 10.5 g/dL — ABNORMAL LOW (ref 12.0–15.0)
MCH: 23.3 pg — AB (ref 26.0–34.0)
MCHC: 32 g/dL (ref 30.0–36.0)
MCV: 72.9 fL — AB (ref 78.0–100.0)
PLATELETS: 176 10*3/uL (ref 150–400)
RBC: 4.5 MIL/uL (ref 3.87–5.11)
RDW: 16.5 % — AB (ref 11.5–15.5)
WBC: 8.8 10*3/uL (ref 4.0–10.5)

## 2015-08-18 LAB — GLUCOSE, CAPILLARY
GLUCOSE-CAPILLARY: 133 mg/dL — AB (ref 65–99)
GLUCOSE-CAPILLARY: 133 mg/dL — AB (ref 65–99)
GLUCOSE-CAPILLARY: 136 mg/dL — AB (ref 65–99)
GLUCOSE-CAPILLARY: 142 mg/dL — AB (ref 65–99)
GLUCOSE-CAPILLARY: 164 mg/dL — AB (ref 65–99)
GLUCOSE-CAPILLARY: 165 mg/dL — AB (ref 65–99)
GLUCOSE-CAPILLARY: 212 mg/dL — AB (ref 65–99)
GLUCOSE-CAPILLARY: 280 mg/dL — AB (ref 65–99)
GLUCOSE-CAPILLARY: 334 mg/dL — AB (ref 65–99)
GLUCOSE-CAPILLARY: 542 mg/dL — AB (ref 65–99)
Glucose-Capillary: 392 mg/dL — ABNORMAL HIGH (ref 65–99)
Glucose-Capillary: 295 mg/dL — ABNORMAL HIGH (ref 65–99)
Glucose-Capillary: 246 mg/dL — ABNORMAL HIGH (ref 65–99)
Glucose-Capillary: 213 mg/dL — ABNORMAL HIGH (ref 65–99)
Glucose-Capillary: 187 mg/dL — ABNORMAL HIGH (ref 65–99)
Glucose-Capillary: 264 mg/dL — ABNORMAL HIGH (ref 65–99)
Glucose-Capillary: 318 mg/dL — ABNORMAL HIGH (ref 65–99)

## 2015-08-18 LAB — HEPATIC FUNCTION PANEL
ALT: 62 U/L — ABNORMAL HIGH (ref 14–54)
AST: 175 U/L — AB (ref 15–41)
Albumin: 2.8 g/dL — ABNORMAL LOW (ref 3.5–5.0)
Alkaline Phosphatase: 236 U/L — ABNORMAL HIGH (ref 38–126)
BILIRUBIN DIRECT: 0.1 mg/dL (ref 0.1–0.5)
BILIRUBIN INDIRECT: 0.6 mg/dL (ref 0.3–0.9)
BILIRUBIN TOTAL: 0.7 mg/dL (ref 0.3–1.2)
Total Protein: 6.1 g/dL — ABNORMAL LOW (ref 6.5–8.1)

## 2015-08-18 LAB — BASIC METABOLIC PANEL
ANION GAP: 7 (ref 5–15)
ANION GAP: 8 (ref 5–15)
Anion gap: 9 (ref 5–15)
Anion gap: 8 (ref 5–15)
BUN: 13 mg/dL (ref 6–20)
BUN: 15 mg/dL (ref 6–20)
BUN: 21 mg/dL — AB (ref 6–20)
BUN: 35 mg/dL — AB (ref 6–20)
BUN: 42 mg/dL — AB (ref 6–20)
CALCIUM: 7.7 mg/dL — AB (ref 8.9–10.3)
CHLORIDE: 100 mmol/L — AB (ref 101–111)
CHLORIDE: 108 mmol/L (ref 101–111)
CHLORIDE: 110 mmol/L (ref 101–111)
CHLORIDE: 110 mmol/L (ref 101–111)
CHLORIDE: 112 mmol/L — AB (ref 101–111)
CO2: 14 mmol/L — ABNORMAL LOW (ref 22–32)
CO2: 16 mmol/L — ABNORMAL LOW (ref 22–32)
CO2: 18 mmol/L — ABNORMAL LOW (ref 22–32)
CO2: 21 mmol/L — ABNORMAL LOW (ref 22–32)
CREATININE: 0.52 mg/dL (ref 0.44–1.00)
CREATININE: 0.78 mg/dL (ref 0.44–1.00)
CREATININE: 1.09 mg/dL — AB (ref 0.44–1.00)
Calcium: 7.9 mg/dL — ABNORMAL LOW (ref 8.9–10.3)
Calcium: 8.2 mg/dL — ABNORMAL LOW (ref 8.9–10.3)
Calcium: 8.3 mg/dL — ABNORMAL LOW (ref 8.9–10.3)
Calcium: 8.4 mg/dL — ABNORMAL LOW (ref 8.9–10.3)
Creatinine, Ser: 0.94 mg/dL (ref 0.44–1.00)
Creatinine, Ser: 0.78 mg/dL (ref 0.44–1.00)
GFR calc Af Amer: >60 mL/min (ref >60)
GFR calc Af Amer: >60 mL/min (ref >60)
GFR calc Af Amer: >60 mL/min (ref >60)
GFR calc Af Amer: >60 mL/min (ref >60)
GFR calc Af Amer: >60 mL/min (ref >60)
GFR calc non Af Amer: >60 mL/min (ref >60)
GFR calc non Af Amer: >60 mL/min (ref >60)
GLUCOSE: 200 mg/dL — AB (ref 65–99)
GLUCOSE: 205 mg/dL — AB (ref 65–99)
GLUCOSE: 603 mg/dL — AB (ref 65–99)
Glucose, Bld: 254 mg/dL — ABNORMAL HIGH (ref 65–99)
Glucose, Bld: 179 mg/dL — ABNORMAL HIGH (ref 65–99)
POTASSIUM: 3.7 mmol/L (ref 3.5–5.1)
POTASSIUM: 4 mmol/L (ref 3.5–5.1)
POTASSIUM: 4 mmol/L (ref 3.5–5.1)
POTASSIUM: 5.1 mmol/L (ref 3.5–5.1)
Potassium: 3.6 mmol/L (ref 3.5–5.1)
SODIUM: 136 mmol/L (ref 135–145)
Sodium: 133 mmol/L — ABNORMAL LOW (ref 135–145)
Sodium: 127 mmol/L — ABNORMAL LOW (ref 135–145)
Sodium: 135 mmol/L (ref 135–145)
Sodium: 137 mmol/L (ref 135–145)

## 2015-08-18 LAB — AMYLASE: Amylase: 58 U/L (ref 28–100)

## 2015-08-18 LAB — PREGNANCY, URINE: Preg Test, Ur: NEGATIVE

## 2015-08-18 LAB — TSH: TSH: 0.012 u[IU]/mL — AB (ref 0.350–4.500)

## 2015-08-18 LAB — LIPASE, BLOOD: LIPASE: 23 U/L (ref 11–51)

## 2015-08-18 LAB — MRSA PCR SCREENING: MRSA BY PCR: POSITIVE — AB

## 2015-08-18 MED ORDER — MUPIROCIN 2 % EX OINT
1.0000 "application " | TOPICAL_OINTMENT | Freq: Two times a day (BID) | CUTANEOUS | Status: DC
  Administered 2015-08-18 – 2015-08-22 (×9): 1 via NASAL
  Filled 2015-08-18 (×2): qty 22

## 2015-08-18 MED ORDER — DIPHENHYDRAMINE HCL 50 MG/ML IJ SOLN: 12.5000 mg | Freq: Once | INTRAMUSCULAR | Status: AC

## 2015-08-18 MED ORDER — CHLORHEXIDINE GLUCONATE CLOTH 2 % EX PADS
6.0000 | MEDICATED_PAD | Freq: Every day | CUTANEOUS | Status: DC
  Administered 2015-08-20 – 2015-08-21 (×2): 6 via TOPICAL

## 2015-08-18 MED ORDER — ONDANSETRON HCL 4 MG/2ML IJ SOLN
4.0000 mg | Freq: Four times a day (QID) | INTRAMUSCULAR | Status: DC | PRN
  Administered 2015-08-21 – 2015-08-22 (×4): 4 mg via INTRAVENOUS
  Filled 2015-08-18 (×5): qty 2

## 2015-08-18 MED ORDER — ACETAMINOPHEN 650 MG RE SUPP: 650.0000 mg | Freq: Four times a day (QID) | RECTAL | Status: DC | PRN

## 2015-08-18 MED ORDER — DEXTROSE-NACL 5-0.45 % IV SOLN
INTRAVENOUS | Status: DC
  Administered 2015-08-18 – 2015-08-19 (×2): via INTRAVENOUS

## 2015-08-18 MED ORDER — ALPRAZOLAM 0.25 MG PO TABS
0.2500 mg | ORAL_TABLET | Freq: Three times a day (TID) | ORAL | Status: DC | PRN
  Administered 2015-08-18 – 2015-08-23 (×12): 0.25 mg via ORAL
  Filled 2015-08-18 (×13): qty 1

## 2015-08-18 MED ORDER — LORAZEPAM 2 MG/ML IJ SOLN
0.5000 mg | Freq: Once | INTRAMUSCULAR | Status: AC
  Administered 2015-08-18: 0.5 mg via INTRAVENOUS
  Filled 2015-08-18: qty 1

## 2015-08-18 MED ORDER — DIPHENHYDRAMINE HCL 50 MG/ML IJ SOLN
12.5000 mg | Freq: Four times a day (QID) | INTRAMUSCULAR | Status: DC | PRN
  Administered 2015-08-18 (×2): 12.5 mg via INTRAMUSCULAR
  Filled 2015-08-18 (×3): qty 1

## 2015-08-18 MED ORDER — DEXTROSE 5 % IV SOLN
500.0000 mg | INTRAVENOUS | Status: DC
  Administered 2015-08-18: 500 mg via INTRAVENOUS
  Filled 2015-08-18 (×4): qty 500

## 2015-08-18 MED ORDER — DIPHENHYDRAMINE HCL 25 MG PO CAPS
25.0000 mg | ORAL_CAPSULE | Freq: Four times a day (QID) | ORAL | Status: DC | PRN
Start: 2015-08-18 — End: 2015-08-19

## 2015-08-18 MED ORDER — VANCOMYCIN HCL IN DEXTROSE 1-5 GM/200ML-% IV SOLN
1000.0000 mg | Freq: Three times a day (TID) | INTRAVENOUS | Status: DC
  Administered 2015-08-18 – 2015-08-19 (×4): 1000 mg via INTRAVENOUS
  Filled 2015-08-18 (×8): qty 200

## 2015-08-18 MED ORDER — INFLUENZA VAC SPLIT QUAD 0.5 ML IM SUSY
0.5000 mL | PREFILLED_SYRINGE | INTRAMUSCULAR | Status: DC
  Filled 2015-08-18 (×2): qty 0.5

## 2015-08-18 MED ORDER — SODIUM CHLORIDE 0.9 % IV SOLN
INTRAVENOUS | Status: DC
  Administered 2015-08-18: 3.3 [IU]/h via INTRAVENOUS
  Administered 2015-08-18: 2.2 [IU]/h via INTRAVENOUS
  Administered 2015-08-18: 2.4 [IU]/h via INTRAVENOUS
  Filled 2015-08-18: qty 2.5

## 2015-08-18 MED ORDER — DEXTROSE 5 % IV SOLN
1.0000 g | Freq: Every day | INTRAVENOUS | Status: DC
  Administered 2015-08-18: 1 g via INTRAVENOUS
  Filled 2015-08-18: qty 10

## 2015-08-18 MED ORDER — PIPERACILLIN-TAZOBACTAM 3.375 G IVPB
3.3750 g | Freq: Three times a day (TID) | INTRAVENOUS | Status: DC
  Administered 2015-08-18 – 2015-08-19 (×2): 3.375 g via INTRAVENOUS
  Filled 2015-08-18 (×6): qty 50

## 2015-08-18 MED ORDER — SODIUM CHLORIDE 0.9% FLUSH
3.0000 mL | Freq: Two times a day (BID) | INTRAVENOUS | Status: DC
  Administered 2015-08-21 – 2015-08-22 (×2): 3 mL via INTRAVENOUS

## 2015-08-18 MED ORDER — PANTOPRAZOLE SODIUM 40 MG IV SOLR
40.0000 mg | Freq: Every day | INTRAVENOUS | Status: DC
  Administered 2015-08-18 (×2): 40 mg via INTRAVENOUS
  Filled 2015-08-18 (×2): qty 40

## 2015-08-18 MED ORDER — ENOXAPARIN SODIUM 40 MG/0.4ML ~~LOC~~ SOLN
40.0000 mg | Freq: Every day | SUBCUTANEOUS | Status: DC
  Administered 2015-08-18: 40 mg via SUBCUTANEOUS
  Filled 2015-08-18: qty 0.4

## 2015-08-18 MED ORDER — ONDANSETRON HCL 4 MG PO TABS
4.0000 mg | ORAL_TABLET | Freq: Four times a day (QID) | ORAL | Status: DC | PRN
  Administered 2015-08-19 – 2015-08-23 (×2): 4 mg via ORAL
  Filled 2015-08-18 (×2): qty 1

## 2015-08-18 MED ORDER — HYDROMORPHONE HCL 1 MG/ML IJ SOLN
0.5000 mg | INTRAMUSCULAR | Status: DC | PRN
  Administered 2015-08-18 – 2015-08-22 (×35): 1 mg via INTRAVENOUS
  Filled 2015-08-18 (×35): qty 1

## 2015-08-18 MED ORDER — SODIUM CHLORIDE 0.9 % IV SOLN: INTRAVENOUS | Status: DC

## 2015-08-18 MED ORDER — ACETAMINOPHEN 325 MG PO TABS: 650.0000 mg | ORAL_TABLET | Freq: Four times a day (QID) | ORAL | Status: DC | PRN

## 2015-08-18 MED ORDER — OXYCODONE-ACETAMINOPHEN 5-325 MG PO TABS
2.0000 | ORAL_TABLET | ORAL | Status: DC | PRN
  Administered 2015-08-18: 2 via ORAL
  Administered 2015-08-19: 1 via ORAL
  Administered 2015-08-19 – 2015-08-23 (×16): 2 via ORAL
  Filled 2015-08-18 (×20): qty 2

## 2015-08-18 NOTE — Progress Notes (Signed)
Patient uncooperative, threatening to leave AMA, and refusing all care until Dr. Venetia Constableanga comes to see her. Patient not willing to tell nursing staff what she needs addressed by doctor. Dr. Venetia Constableanga notified of situation and will be rounding on patient within 30-45 minutes.

## 2015-08-18 NOTE — Progress Notes (Signed)
Pt screaming and yelling out. Yelling at sister, Vallery Ridgedona, visiting with her. Pt upset that she cannot get what she has asked for all day. When asked by this RN what she wants me to do for her at this time she says yells and crys and she says she has to keep saying it over and over again, however, she is random with requests/demands. She wants Phenergan and pain med and her left arm is hurting, and something is wrong with her ribs, and then she bursts into cussing her sister. Both she and her sister talk at the same time over the nurses. VP came to draw blood and the pt refused saying "if they don't give me what I want why should I give them what they want". Her sister spoke to this RN in the hall and said Ninette lives with her and her children and sleeps with her 30 year old son. She states, "she is a drug addict, my landlord has evicted her from my house in 14 days. I share my Insulin with her and she wants pain medicine. I can't lose my house. I have a large house and she has learned to do a lot with 1 arm but I need help. I don't know what to do. Social work called and message left to call this Charity fundraiserN.

## 2015-08-18 NOTE — Progress Notes (Addendum)
[facility] PROGRESS NOTE  Jessica Tapia ZOX:[id number] DOB: 09/15/1985 DOA: 08/17/2015 PCP: No primary care provider on file.  Summary 08/18/15: I have seen and examined Jessica Tapia at bedside and reviewed her chart. Discussed with nursing. Jessica Tapia is a 30 year old female with a past medical history of diabetes type I, who came in with nausea, vomiting, diarrhea and abdominal pain and was found to have diabetic ketoacidosis with UTI/suggestion of pneumonia on chest x-ray. MRSA PCR is positive. She is tachycardic with low-grade fever hence question of sepsis. She ist still in DKA but demands to eat. She complains of abdominal pain/pleuritic chest pain. Will add Zithromax/Vanc/Zosyn, d/c Ceftriaxone, check HbA1c and Keep in SDU today. Will D/c Lovenox as patient has (Microcytic anemia). Will adjust pain medications as possible. Plan DKA (diabetic ketoacidoses) (HCC)  DKA protocol including IV fluids/insulin drip/electrolyte replacement management as necessary  Check hemoglobin A1c Diarrhea/Nausea and vomiting/Abdominal pain  ? Related to DKA, monitor and image abdomen if persists  Check amylase/lipase MRSA carrier/UTI (urinary tract infection)/PNA (pneumonia)  Vanc/Zosyn/Zithromax and de-escalate antibiotics depending on septic workup including clinical progress  Code Status: Full Code Family Communication: Patient at bedside Disposition Plan: Keep in SDU today.   Consultants:  None  Procedures:    Antibiotics:  Ceftriaxone 08/17/15>>08/18/15  Vanc 08/18/15>>  Zosyn 08/18/15>>  Zithromax 08/18/15>>  HPI/Subjective: Multiple complaints including abdominal pain and pleuritic chest pain. Asks for pain medications/anxiety medications.  Objective: Filed Vitals:   08/18/15 1600 08/18/15 1607  BP:  128/79  Pulse:  126  Temp: 97.8 F (36.6 C)   Resp:  22    Intake/Output Summary (Last 24 hours) at 08/18/15 1949 Last data filed at 08/18/15 1811  Gross per 24 hour  Intake  4369.77 ml  Output   3950 ml  Net 419.77 ml   Filed Weights   08/18/15 0011 08/18/15 0500  Weight: 65.4 kg (144 lb 2.9 oz) 65.4 kg (144 lb 2.9 oz)    Exam:   General:  Anxious.  Cardiovascular: S1-S2 normal. No murmurs. Regular tachycardia.  Respiratory: Good air entry bilaterally. No rhonchi or rales.  Abdomen: Soft and tender to palpation, more on the left upper quadrant. Normal bowel sounds. No organomegaly.  Musculoskeletal: No pedal edema   Neurological: Intact  Data Reviewed: Basic Metabolic Panel:  Recent Labs Lab 08/17/15 2128 08/18/15 0028 08/18/15 0328 08/18/15 0949 08/18/15 1808  NA 127* 127* 133* 136 135  K 5.7* 5.1 4.0 3.6 3.7  CL 97* 100* 110 112* 110  CO2 10* <7* 14* 16* 18*  GLUCOSE 591* 603* 254* 179* 205*  BUN 38* 42* 35* 21* 13  CREATININE 0.98 1.09* 0.78 0.52 0.94  CALCIUM 8.9 8.2* 7.9* 7.7* 8.3*   Liver Function Tests:  Recent Labs Lab 08/17/15 2128 08/18/15 0328  AST 115* 175*  ALT 37 62*  ALKPHOS 203* 236*  BILITOT 1.3* 0.7  PROT 7.1 6.1*  ALBUMIN 3.3* 2.8*   No results for input(s): LIPASE, AMYLASE in the last 168 hours. No results for input(s): AMMONIA in the last 168 hours. CBC:  Recent Labs Lab 08/17/15 2128 08/18/15 0328  WBC 7.4 8.8  NEUTROABS 4.4  --   HGB 11.5* 10.5*  HCT 38.0 32.8*  MCV 75.0* 72.9*  PLT 187 176   Cardiac Enzymes: No results for input(s): CKTOTAL, CKMB, CKMBINDEX, TROPONINI in the last 168 hours. BNP (last 3 results) No results for input(s): BNP in the last 8760 hours.  ProBNP (last 3 results) No results for input(s): PROBNP  in the last 8760 hours.  CBG:  Recent Labs Lab 08/18/15 1158 08/18/15 1343 08/18/15 1449 08/18/15 1625 08/18/15 1743  GLUCAP 264* 334* 318* 280* 212*    Recent Results (from the past 240 hour(s))  MRSA PCR Screening     Status: Abnormal   Collection Time: 08/18/15 12:05 AM  Result Value Ref Range Status   MRSA by PCR POSITIVE (A) NEGATIVE Final     Comment:        The GeneXpert MRSA Assay (FDA approved for NASAL specimens only), is one component of a comprehensive MRSA colonization surveillance program. It is not intended to diagnose MRSA infection nor to guide or monitor treatment for MRSA infections. RESULT CALLED TO, READ BACK BY AND VERIFIED WITH: AWebb Silversmith RN AT 0340 ON 03.03.17 BY SHUEA      Studies: Dg Chest Port 1 View  08/17/2015  CLINICAL DATA:  Diabetic ketoacidosis.  Cough. EXAM: PORTABLE CHEST 1 VIEW COMPARISON:  None. FINDINGS: Cardiac silhouette is normal in size and configuration. Normal mediastinal and hilar contours. There are irregularly thickened bronchovascular/interstitial markings which predominate centrally. No lung consolidation is seen to suggest pneumonia. No pleural effusion or pneumothorax. Skeletal structures are unremarkable. IMPRESSION: 1. Bilateral thickened bronchovascular/interstitial markings. This could be due to diffuse bronchitis. Findings may reflect mild interstitial edema. No evidence of lobar pneumonia. Electronically Signed   By: Amie Portland M.D.   On: 08/17/2015 22:22    Scheduled Meds: . azithromycin  500 mg Intravenous Q24H  . Chlorhexidine Gluconate Cloth  6 each Topical Q0600  . [START ON 08/19/2015] Influenza vac split quadrivalent PF  0.5 mL Intramuscular Tomorrow-1000  . mupirocin ointment  1 application Nasal BID  . pantoprazole (PROTONIX) IV  40 mg Intravenous QHS  . piperacillin-tazobactam  3.375 g Intravenous Q8H  . sodium chloride flush  3 mL Intravenous Q12H  . vancomycin  1,000 mg Intravenous Q8H   Continuous Infusions: . sodium chloride    . sodium chloride Stopped (08/18/15 0323)  . dextrose 5 % and 0.45% NaCl 125 mL/hr at 08/18/15 1800  . insulin (NOVOLIN-R) infusion 7.6 mL/hr at 08/18/15 1800     Time spent: 25 minutes    Dajai Wahlert  [facility] Pager [phone]. If 7PM-7AM, please contact night-coverage at [email or web address], password  [facility] 08/18/2015, 7:49 PM  LOS: 1 day

## 2015-08-18 NOTE — Care Management Note (Signed)
Case Management Note  Patient Details  Name: Jessica Tapia MRN: [id number] Date of Birth: 01/11/1986  Subjective/Objective:       dka and insulin drip             Action/Plan:Date:  August 18, 2015 Chart reviewed for concurrent status and case management needs. Will continue to follow patient for changes and needs: Marcelle Smilinghonda Davis, BSN, RN, [place]   [phone]   Expected Discharge Date:                  Expected Discharge Plan:  Home/Self Care  In-House Referral:  NA  Discharge planning Services  CM Consult, [facility]  Post Acute Care Choice:  NA Choice offered to:  NA  DME Arranged:    DME Agency:     HH Arranged:    HH Agency:     Status of Service:  In process, will continue to follow  Medicare Important Message Given:    Date Medicare IM Given:    Medicare IM give by:    Date Additional Medicare IM Given:    Additional Medicare Important Message give by:     If discussed at Long Length of Stay Meetings, dates discussed:    Additional Comments:  Golda AcreDavis, Rhonda Lynn, RN 08/18/2015, 10:46 AM

## 2015-08-18 NOTE — Progress Notes (Signed)
Pharmacy Antibiotic Note  Jessica Tapia is a 30 y.o. female admitted on 08/17/2015 for DKA; found to have sepsis d/t UTI + diffuse bronchitis.  Pharmacy has been consulted for vancomycin dosing.  Today, 08/18/2015: Temp: afebrile WBC: wnl Renal: SCr stable wnl; CrCl > 90 CG  Plan:  Vancomycin 1000 mg IV q8 hr; goal trough 15-20 mcg/mL  Measure vancomycin trough levels at steady state as indicated  Zosyn and Azithromycin dosing is appropriate.  Follow clinical course, renal function, culture results as available  Follow for de-escalation of antibiotics and LOT   Height: 5\' 9"  (175.3 cm) Weight: 144 lb 2.9 oz (65.4 kg) IBW/kg (Calculated) : 66.2  Temp (24hrs), Avg:98.6 F (37 C), Min:98 F (36.7 C), Max:99.4 F (37.4 C)   Recent Labs Lab 08/17/15 2128 08/18/15 0028 08/18/15 0328  WBC 7.4  --  8.8  CREATININE 0.98 1.09* 0.78    Estimated Creatinine Clearance: 107.1 mL/min (by C-G formula based on Cr of 0.78).    Allergies  Allergen Reactions  . Sulfa Antibiotics     Reaction: unknown     Antimicrobials this admission: Rocephin x 1 Vancomycin 3/3 >>  Zosyn 3/3 >>  Azithromycin 3/3 >>   Dose adjustments this admission: ---  Microbiology results: 3/2 UCx: IP  3/2 Sputum: NR  3/3 MRSA PCR: POS  Thank you for allowing pharmacy to be a part of this patient's care.  Bernadene Personrew Kaeo Jacome, PharmD, BCPS Pager: [phone] 08/18/2015, 9:27 AM

## 2015-08-18 NOTE — Progress Notes (Addendum)
Inpatient Diabetes Program Recommendations  AACE/ADA: New Consensus Statement on Inpatient Glycemic Control (2015)  Target Ranges:  Prepandial:   less than 140 mg/dL      Peak postprandial:   less than 180 mg/dL (1-2 hours)      Critically ill patients:  140 - 180 mg/dL   Results for KADE, RICKELS (MRN [id number]) as of 08/18/2015 08:44  Ref. Range 08/17/2015 21:28  Sodium Latest Ref Range: 135-145 mmol/L 127 (L)  Potassium Latest Ref Range: 3.5-5.1 mmol/L 5.7 (H)  Chloride Latest Ref Range: 101-111 mmol/L 97 (L)  CO2 Latest Ref Range: 22-32 mmol/L 10 (L)  BUN Latest Ref Range: 6-20 mg/dL 38 (H)  Creatinine Latest Ref Range: 0.44-1.00 mg/dL 0.98  Calcium Latest Ref Range: 8.9-10.3 mg/dL 8.9  EGFR (Non-African Amer.) Latest Ref Range: >60 mL/min >60  EGFR (African American) Latest Ref Range: >60 mL/min >60  Glucose Latest Ref Range: 65-99 mg/dL 591 (HH)  Anion gap Latest Ref Range: 5-15  20 (H)    Admit with: DKA/ UTI  History: Type 1 DM (diagnosed 4 years ago)  Home DM Meds: Lantus 22 units AM/ 24 units PM        Novolog 1-10 units tidwc  Current Insulin Orders: IV Insulin drip per DKA orders     -Note BMET from 3:30am today shows patient's CO2 only up to 14.  Needs to be closer to 20 before transition to SQ insulin should occur per DKA protocol guidelines.  -Note that patient does not have PCP, nor Endocrinologist.  Also do not see that patient has any insurance coverage.  Not sure how she is affording her insulin.  -Current Hemoglobin A1c pending.  -Will speak with patient today to try to get better picture of her DM care regimen at home.    MD- When patient's CO2 is closer to 20 and she is ready to transition to SQ insulin, please consider the following for transition off the IV insulin drip:  1. Start Lantus 25 units daily (this would be 0.4 units/kg dosing) (make sure 1st dose of Lantus is given at least 1 hour before IV insulin drip stopped)  2. Start Novolog  Sensitive Correction Scale/ SSI (0-9 units) TID AC + HS  3. Start Novolog 3 units tidwc (meal coverage once patient is started back on PO diet)    Addendum 1330: Spoke with patient about her DM care regimen at home.  Has not seen MD in a long time.  Cannot afford her insulins (Lantus and Novolog).  Plans to apply for Medicaid.  Only uses Novolog SSI at home and does not count carbs.  Stated her last A1c was very high (14%).  Patient was very groggy during my interview with her and kept telling me she was sorry she was falling asleep.  Will ask RD to visit with patient when appropriate so patient can get more information on carbohydrate counting at home.  Patient needs a better insulin regimen at home (needs to count carbs and correct her CBGs at home) but the biggest issue she has is lack of a PCP or ENDO and lack of health insurance coverage.  Note care management following patient.  Patient may be able to follow up with the doctors at the [facility] after d/c.  Note patient can get [facility] program that will cover her insulin for 1 month, however, after the month is up she will not be able to afford her insulins.  The [facility] may be able to get patient signed up for insulin assistance with the various insulin companies.  If a more affordable insulin regimen is desired at time of d/c, recommend switching patient to Reli-on 70/30 insulin that can be purchased for $25 per vial at [facility] until she can get insulin assistance or Medicaid.     --Will follow patient during hospitalization--  Wyn Quaker RN, MSN, CDE Diabetes Coordinator Inpatient Glycemic Control Team Team Pager: [phone] (8a-5p)

## 2015-08-18 NOTE — Progress Notes (Addendum)
Patient refusing all care (doesnt want IV insulin/IV fluid infusing, refusing lab work, and blood sugar checks.). Dr. Venetia Constableanga informed of situation and patient wanting to leave AMA again. Dr. Venetia Constableanga spoke with patient over telephone about treatment plan and addressed/answered all concerns from patient. Patient became angry and began screaming over the phone at Dr. Venetia Constableanga and asked for this RN Lynford Humphrey(Shanda) to come in room. Upon entry in room, patient began to yell, curse and demand this nurse to take IV out of arm so that she can leave and go somewhere else. Ms. Logan Boresvans then asked to speak with upper level management because she felt that nurse and MD "didnt know what was going on with her". ChiropodistAssistant Director, Oneita HurtErin Bull at bedside to speak with patient.

## 2015-08-19 ENCOUNTER — Inpatient Hospital Stay (HOSPITAL_COMMUNITY): Payer: Self-pay

## 2015-08-19 DIAGNOSIS — I10 Essential (primary) hypertension: Secondary | ICD-10-CM | POA: Diagnosis present

## 2015-08-19 DIAGNOSIS — R451 Restlessness and agitation: Secondary | ICD-10-CM

## 2015-08-19 DIAGNOSIS — E876 Hypokalemia: Secondary | ICD-10-CM | POA: Diagnosis not present

## 2015-08-19 LAB — CBC WITH DIFFERENTIAL/PLATELET
Basophils Absolute: 0 10*3/uL (ref 0.0–0.1)
Basophils Relative: 0 %
EOS PCT: 5 %
Eosinophils Absolute: 0.2 10*3/uL (ref 0.0–0.7)
HCT: 30.9 % — ABNORMAL LOW (ref 36.0–46.0)
Hemoglobin: 9.8 g/dL — ABNORMAL LOW (ref 12.0–15.0)
LYMPHS ABS: 2.2 10*3/uL (ref 0.7–4.0)
LYMPHS PCT: 51 %
MCH: 23.2 pg — AB (ref 26.0–34.0)
MCHC: 31.7 g/dL (ref 30.0–36.0)
MCV: 73.2 fL — AB (ref 78.0–100.0)
MONO ABS: 0.4 10*3/uL (ref 0.1–1.0)
Monocytes Relative: 9 %
Neutro Abs: 1.5 10*3/uL — ABNORMAL LOW (ref 1.7–7.7)
Neutrophils Relative %: 35 %
PLATELETS: 152 10*3/uL (ref 150–400)
RBC: 4.22 MIL/uL (ref 3.87–5.11)
RDW: 17.1 % — ABNORMAL HIGH (ref 11.5–15.5)
WBC: 4.2 10*3/uL (ref 4.0–10.5)

## 2015-08-19 LAB — URINE CULTURE

## 2015-08-19 LAB — BASIC METABOLIC PANEL
Anion gap: 8 (ref 5–15)
BUN: 14 mg/dL (ref 6–20)
CO2: 20 mmol/L — ABNORMAL LOW (ref 22–32)
Calcium: 8.8 mg/dL — ABNORMAL LOW (ref 8.9–10.3)
Chloride: 110 mmol/L (ref 101–111)
Creatinine, Ser: 0.43 mg/dL — ABNORMAL LOW (ref 0.44–1.00)
GFR calc Af Amer: >60 mL/min (ref >60)
GLUCOSE: 121 mg/dL — AB (ref 65–99)
POTASSIUM: 3.4 mmol/L — AB (ref 3.5–5.1)
Sodium: 138 mmol/L (ref 135–145)

## 2015-08-19 LAB — GLUCOSE, CAPILLARY
GLUCOSE-CAPILLARY: 195 mg/dL — AB (ref 65–99)
GLUCOSE-CAPILLARY: 176 mg/dL — AB (ref 65–99)
GLUCOSE-CAPILLARY: 127 mg/dL — AB (ref 65–99)
GLUCOSE-CAPILLARY: 168 mg/dL — AB (ref 65–99)
GLUCOSE-CAPILLARY: 128 mg/dL — AB (ref 65–99)
GLUCOSE-CAPILLARY: 392 mg/dL — AB (ref 65–99)
Glucose-Capillary: 112 mg/dL — ABNORMAL HIGH (ref 65–99)
Glucose-Capillary: 130 mg/dL — ABNORMAL HIGH (ref 65–99)
Glucose-Capillary: 200 mg/dL — ABNORMAL HIGH (ref 65–99)
Glucose-Capillary: 217 mg/dL — ABNORMAL HIGH (ref 65–99)
Glucose-Capillary: 217 mg/dL — ABNORMAL HIGH (ref 65–99)
Glucose-Capillary: 268 mg/dL — ABNORMAL HIGH (ref 65–99)
Glucose-Capillary: 373 mg/dL — ABNORMAL HIGH (ref 65–99)
Glucose-Capillary: 295 mg/dL — ABNORMAL HIGH (ref 65–99)

## 2015-08-19 LAB — HEPATIC FUNCTION PANEL
ALK PHOS: 207 U/L — AB (ref 38–126)
ALT: 73 U/L — AB (ref 14–54)
AST: 104 U/L — ABNORMAL HIGH (ref 15–41)
Albumin: 2.8 g/dL — ABNORMAL LOW (ref 3.5–5.0)
BILIRUBIN TOTAL: 0.1 mg/dL — AB (ref 0.3–1.2)
Total Protein: 6.2 g/dL — ABNORMAL LOW (ref 6.5–8.1)

## 2015-08-19 LAB — HEMOGLOBIN A1C
Hgb A1c MFr Bld: 14.2 % — ABNORMAL HIGH (ref 4.8–5.6)
Mean Plasma Glucose: 361 mg/dL

## 2015-08-19 LAB — MAGNESIUM: Magnesium: 1.4 mg/dL — ABNORMAL LOW (ref 1.7–2.4)

## 2015-08-19 LAB — PHOSPHORUS: Phosphorus: 3.8 mg/dL (ref 2.5–4.6)

## 2015-08-19 MED ORDER — ZOLPIDEM TARTRATE 5 MG PO TABS
5.0000 mg | ORAL_TABLET | Freq: Once | ORAL | Status: AC
  Administered 2015-08-19: 5 mg via ORAL
  Filled 2015-08-19: qty 1

## 2015-08-19 MED ORDER — INSULIN DETEMIR 100 UNIT/ML ~~LOC~~ SOLN
15.0000 [IU] | Freq: Two times a day (BID) | SUBCUTANEOUS | Status: DC
  Administered 2015-08-19 (×2): 15 [IU] via SUBCUTANEOUS
  Filled 2015-08-19 (×3): qty 0.15

## 2015-08-19 MED ORDER — HYDRALAZINE HCL 20 MG/ML IJ SOLN: 10.0000 mg | Freq: Three times a day (TID) | INTRAMUSCULAR | Status: DC | PRN

## 2015-08-19 MED ORDER — DIPHENHYDRAMINE HCL 50 MG/ML IJ SOLN
12.5000 mg | Freq: Four times a day (QID) | INTRAMUSCULAR | Status: DC | PRN
  Administered 2015-08-19 – 2015-08-20 (×7): 12.5 mg via INTRAVENOUS
  Filled 2015-08-19 (×6): qty 1

## 2015-08-19 MED ORDER — SODIUM CHLORIDE 0.9% FLUSH
10.0000 mL | INTRAVENOUS | Status: DC | PRN
  Administered 2015-08-21 – 2015-08-22 (×2): 10 mL
  Filled 2015-08-19 (×2): qty 40

## 2015-08-19 MED ORDER — PANTOPRAZOLE SODIUM 40 MG PO TBEC
40.0000 mg | DELAYED_RELEASE_TABLET | Freq: Every day | ORAL | Status: DC
  Administered 2015-08-19 – 2015-08-23 (×5): 40 mg via ORAL
  Filled 2015-08-19 (×6): qty 1

## 2015-08-19 MED ORDER — HYDROMORPHONE HCL 1 MG/ML IJ SOLN
1.0000 mg | Freq: Once | INTRAMUSCULAR | Status: AC
  Administered 2015-08-19: 1 mg via INTRAVENOUS
  Filled 2015-08-19: qty 1

## 2015-08-19 MED ORDER — INSULIN ASPART 100 UNIT/ML ~~LOC~~ SOLN
0.0000 [IU] | Freq: Three times a day (TID) | SUBCUTANEOUS | Status: DC
  Administered 2015-08-19: 1 [IU] via SUBCUTANEOUS
  Administered 2015-08-19 (×2): 9 [IU] via SUBCUTANEOUS
  Administered 2015-08-20: 3 [IU] via SUBCUTANEOUS

## 2015-08-19 MED ORDER — POTASSIUM CHLORIDE IN NACL 20-0.9 MEQ/L-% IV SOLN
INTRAVENOUS | Status: DC
  Administered 2015-08-19 – 2015-08-20 (×2): via INTRAVENOUS
  Filled 2015-08-19 (×4): qty 1000

## 2015-08-19 MED ORDER — NICOTINE 21 MG/24HR TD PT24
21.0000 mg | MEDICATED_PATCH | Freq: Every day | TRANSDERMAL | Status: DC
  Administered 2015-08-19 – 2015-08-21 (×3): 21 mg via TRANSDERMAL
  Filled 2015-08-19 (×5): qty 1

## 2015-08-19 MED ORDER — AMLODIPINE BESYLATE 5 MG PO TABS
5.0000 mg | ORAL_TABLET | Freq: Every day | ORAL | Status: DC
Start: 2015-08-19 — End: 2015-08-20
  Administered 2015-08-19: 5 mg via ORAL
  Filled 2015-08-19 (×2): qty 1

## 2015-08-19 MED ORDER — MAGNESIUM SULFATE 2 GM/50ML IV SOLN
2.0000 g | Freq: Once | INTRAVENOUS | Status: AC
  Administered 2015-08-19: 2 g via INTRAVENOUS
  Filled 2015-08-19: qty 50

## 2015-08-19 MED ORDER — INSULIN GLARGINE 100 UNIT/ML ~~LOC~~ SOLN
10.0000 [IU] | Freq: Once | SUBCUTANEOUS | Status: AC
Start: 2015-08-19 — End: 2015-08-19
  Administered 2015-08-19: 10 [IU] via SUBCUTANEOUS
  Filled 2015-08-19: qty 0.1

## 2015-08-19 NOTE — Progress Notes (Signed)
As the admitting hospitalist overnight, I was asked to see Ms. Hymon as she had been "demanding to talk to the doctor."  Her chart was reviewed. She is a type I diabetic admitted with DKA. There is concern for PNA on CXR. She is receiving insulin, fluids, and electrolytes per standard DKA treatment.  She is on appropriate antibiotics for suspected PNA.  She has been receiving analgesia with Percocet and IV Dilaudid, and IV Benadryl for associated itching.  Ativan is being given IV for anxiety.   Pt is interviewed. She is hyperactive, sitting up in bed fidgeting with IV tubing and tele leads. She is emotionally labile, heaping praise on staff one moment and cursing them the next. She is tearful, pleading for higher dose Benadryl and more frequent Dilaudid. Her source of pain is the left lateral chest wall, worse with deep inspiration or cough. There is no overlying discoloration or deformity and pain not exacerbated with palpation.   Her pain and itching seem to be treated appropriately, and she is not in any apparent distress or discomfort at this time. She also complains of insomnia. Ambien x1 will be offered prn insomnia. All of her needs appear to be dealt with appropriately at this time.   Pt is heard yelling and cursing from the next hall over as I leave, threatening to leave AMA, yelling at RN about there being crumbs in her bed.

## 2015-08-19 NOTE — Progress Notes (Signed)
[facility] PROGRESS NOTE  Jessica Tapia WJX:[id number] DOB: 03/19/1986 DOA: 08/17/2015 PCP: No primary care provider on file.  Summary 08/18/15: I have seen and examined [name] at bedside and reviewed her chart. Discussed with nursing. Jessica Tapia is a 30 year old female with a past medical history of diabetes type I, who came in with nausea, vomiting, diarrhea and abdominal pain and was found to have diabetic ketoacidosis with UTI/suggestion of pneumonia on chest x-ray. MRSA PCR is positive. She is tachycardic with low-grade fever hence question of sepsis. She ist still in DKA but demands to eat. She complains of abdominal pain/pleuritic chest pain. Will add Zithromax/Vanc/Zosyn, d/c Ceftriaxone, check HbA1c and Keep in SDU today. Will D/c Lovenox as patient has (Microcytic anemia). Will adjust pain medications as possible. 08/19/15: Now out of DKA. Hemoglobin A1c 14.2. Will transition to Levemir/SSI and adjust dose to optimize control. Patient also complains of persistent left-sided pleuritic chest pain. We'll therefore obtain CT chest without contrast to better evaluate lung parenchyma-likely pneumonia, hopefully no complications related to pneumonia. Patient hypokalemic/hypomagnesemia make/hypertensive. We'll replenish electrolytes and start Norvasc/hydralazine and reassess. There is element of pain contributing to high blood pressure. Electrolyte abnormalities likely related to GI loss from diarrhea. Patient is stable for transfer to MedSurg unit when bed becomes available. Plan DKA (diabetic ketoacidoses) (HCC)/diabetes mellitus type 1  Poorly controlled long-term, hemoglobin A1c 14.2.   Levemir/ssi. Diarrhea/Nausea and vomiting/Abdominal pain/hypokalemia/hypomagnesemia  Amylase/lipase normal  Replenish potassium/magnesium, give antiemetics as needed  MRSA carrier/UTI (urinary tract infection)/PNA (pneumonia)  Day 2 Vanc/Zosyn/Zithromax   Follow septic work up, de-escalate  antibiotics depending on septic workup including clinical progress  CT chest without contrast Essential Htn  Norvasc  Hydralazine as needed. Pain control. Code Status: Full Code Family Communication: Patient at bedside Disposition Plan: Transfer to med surg when bed becomes available..   Consultants:  None  Procedures:    Antibiotics:  Ceftriaxone 08/17/15>>08/18/15  Vanc 08/18/15>>  Zosyn 08/18/15>>  Zithromax 08/18/15>>  HPI/Subjective: Complaints of left sided pleuritic chest pain.  Objective: Filed Vitals:   08/19/15 0845 08/19/15 1005  BP: 188/106 157/109  Pulse:    Temp:    Resp: 22 12    Intake/Output Summary (Last 24 hours) at 08/19/15 1010 Last data filed at 08/19/15 0500  Gross per 24 hour  Intake 3629.54 ml  Output   1200 ml  Net 2429.54 ml   Filed Weights   08/18/15 0011 08/18/15 0500  Weight: 65.4 kg (144 lb 2.9 oz) 65.4 kg (144 lb 2.9 oz)    Exam:   General:  Comfortable at rest.  Cardiovascular: S1-S2 normal. No murmurs. Pulse regular.  Respiratory: Good air entry bilaterally. No rhonchi or rales.  Abdomen: Soft and nontender. Normal bowel sounds. No organomegaly.  Musculoskeletal: No pedal edema   Neurological: Intact  Data Reviewed: Basic Metabolic Panel:  Recent Labs Lab 08/18/15 0328 08/18/15 0949 08/18/15 1808 08/18/15 2200 08/19/15 0341  NA 133* 136 135 137 138  K 4.0 3.6 3.7 4.0 3.4*  CL 110 112* 110 108 110  CO2 14* 16* 18* 21* 20*  GLUCOSE 254* 179* 205* 200* 121*  BUN 35* 21* 13 15 14   CREATININE 0.78 0.52 0.94 0.78 0.43*  CALCIUM 7.9* 7.7* 8.3* 8.4* 8.8*  MG  --   --   --   --  1.4*  PHOS  --   --   --   --  3.8   Liver Function Tests:  Recent Labs Lab 08/17/15 2128 08/18/15 0328 08/19/15  0341  AST 115* 175* 104*  ALT 37 62* 73*  ALKPHOS 203* 236* 207*  BILITOT 1.3* 0.7 0.1*  PROT 7.1 6.1* 6.2*  ALBUMIN 3.3* 2.8* 2.8*    Recent Labs Lab 08/18/15 2200  LIPASE 23  AMYLASE 58   No results for  input(s): AMMONIA in the last 168 hours. CBC:  Recent Labs Lab 08/17/15 2128 08/18/15 0328 08/19/15 0341  WBC 7.4 8.8 4.2  NEUTROABS 4.4  --  1.5*  HGB 11.5* 10.5* 9.8*  HCT 38.0 32.8* 30.9*  MCV 75.0* 72.9* 73.2*  PLT 187 176 152   Cardiac Enzymes: No results for input(s): CKTOTAL, CKMB, CKMBINDEX, TROPONINI in the last 168 hours. BNP (last 3 results) No results for input(s): BNP in the last 8760 hours.  ProBNP (last 3 results) No results for input(s): PROBNP in the last 8760 hours.  CBG:  Recent Labs Lab 08/19/15 0135 08/19/15 0243 08/19/15 0332 08/19/15 0422 08/19/15 0526  GLUCAP 176* 217* 130* 127* 168*    Recent Results (from the past 240 hour(s))  MRSA PCR Screening     Status: Abnormal   Collection Time: 08/18/15 12:05 AM  Result Value Ref Range Status   MRSA by PCR POSITIVE (A) NEGATIVE Final    Comment:        The GeneXpert MRSA Assay (FDA approved for NASAL specimens only), is one component of a comprehensive MRSA colonization surveillance program. It is not intended to diagnose MRSA infection nor to guide or monitor treatment for MRSA infections. RESULT CALLED TO, READ BACK BY AND VERIFIED WITH: AWebb Silversmith RN AT 0340 ON 03.03.17 BY SHUEA      Studies: Dg Chest Port 1 View  08/17/2015  CLINICAL DATA:  Diabetic ketoacidosis.  Cough. EXAM: PORTABLE CHEST 1 VIEW COMPARISON:  None. FINDINGS: Cardiac silhouette is normal in size and configuration. Normal mediastinal and hilar contours. There are irregularly thickened bronchovascular/interstitial markings which predominate centrally. No lung consolidation is seen to suggest pneumonia. No pleural effusion or pneumothorax. Skeletal structures are unremarkable. IMPRESSION: 1. Bilateral thickened bronchovascular/interstitial markings. This could be due to diffuse bronchitis. Findings may reflect mild interstitial edema. No evidence of lobar pneumonia. Electronically Signed   By: Amie Portland M.D.   On:  08/17/2015 22:22    Scheduled Meds: . azithromycin  500 mg Intravenous Q24H  . Chlorhexidine Gluconate Cloth  6 each Topical Q0600  . Influenza vac split quadrivalent PF  0.5 mL Intramuscular Tomorrow-1000  . insulin aspart  0-9 Units Subcutaneous TID WC  . insulin detemir  15 Units Subcutaneous BID  . magnesium sulfate 1 - 4 g bolus IVPB  2 g Intravenous Once  . mupirocin ointment  1 application Nasal BID  . pantoprazole  40 mg Oral Q1200  . piperacillin-tazobactam  3.375 g Intravenous Q8H  . sodium chloride flush  3 mL Intravenous Q12H  . vancomycin  1,000 mg Intravenous Q8H   Continuous Infusions: . 0.9 % NaCl with KCl 20 mEq / L       Time spent: 25 minutes    Elijio Staples  [facility] Pager [phone]. If 7PM-7AM, please contact night-coverage at [email or web address], password [facility] 08/19/2015, 10:10 AM  LOS: 2 days

## 2015-08-19 NOTE — Progress Notes (Signed)
At 0845 pt "felt strange" like she was about to have a seizure.  Rechecked cbg and glucose was 268.  Then pt stated she needed her pain meds and she has been waiting "forever".  Informed her that I could not admin her pain meds due to it not being time and since she was having "strange sensation".  Pt was upset with this nurse.  Informed Dr. Ranga.  EriVenetia Constableck Blinksuchman, Vint Pola D, RN

## 2015-08-19 NOTE — Progress Notes (Signed)
Pt has had a very difficult night, emotionally unstable.  This patient has been demanding, yelling at all of the staff and threatening to leave AMA.  She has been given dilaudid, xanax and benadryl as orders permit.  She has demanded to see a MD, which [facility] on call has been called several times regarding her care.  T. Opyd, MD has came and talked to her and ordered ambien x 1 dose.  Pt is also on an insulin drip she has insisted on eating and drinking almost every hour making it impossible to cover her carbs. Its been very difficult to reason with patient although she is alert and oriented.  She has refused to get a second iv, which she needs because of incompatible medications.  Pt hasn't received insulin drip continuously because she has constantly moved her arm occluding her site.  Charge RN aware of pt issues as well.  Will continue to monitor.

## 2015-08-19 NOTE — Progress Notes (Signed)
Pt said to "take my IV out and put it somewhere else".  Pt stated that her IV started to "burn" after the azithromycin antibiotics was started.  This nurse removed IV and explained to the pt that a new would need to be inserted.  Pt was visibly upset by having to have another IV.  Attempted to insert 1 time and was unsuccessful.  Pt was too upset to attempt another time.  Will have Vascular access place IV.  Erick Blinksuchman, Kentravious Lipford D, RN

## 2015-08-19 NOTE — Progress Notes (Signed)
Peripherally Inserted Central Catheter/Midline Placement  The IV Nurse has discussed with the patient and/or persons authorized to consent for the patient, the purpose of this procedure and the potential benefits and risks involved with this procedure.  The benefits include less needle sticks, lab draws from the catheter and patient may be discharged home with the catheter.  Risks include, but not limited to, infection, bleeding, blood clot (thrombus formation), and puncture of an artery; nerve damage and irregular heat beat.  Alternatives to this procedure were also discussed.  PICC/Midline Placement Documentation  PICC Double Lumen 08/19/15 PICC Right Basilic 40 cm 0 cm (Active)  Indication for Insertion or Continuance of Line Prolonged intravenous therapies;Poor Vasculature-patient has had multiple peripheral attempts or PIVs lasting less than 24 hours;Limited venous access - need for IV therapy >5 days (PICC only) 08/19/2015  3:03 PM  Exposed Catheter (cm) 0 cm 08/19/2015  3:03 PM  Site Assessment Clean;Dry;Intact 08/19/2015  3:03 PM  Lumen #1 Status Flushed;Saline locked;Blood return noted 08/19/2015  3:03 PM  Lumen #2 Status Saline locked;Flushed;Blood return noted 08/19/2015  3:03 PM  Dressing Type Transparent 08/19/2015  3:03 PM  Dressing Status Clean;Dry;Intact;Antimicrobial disc in place 08/19/2015  3:03 PM  Line Care Connections checked and tightened 08/19/2015  3:03 PM  Line Adjustment (NICU/IV Team Only) No 08/19/2015  3:03 PM  Dressing Intervention New dressing 08/19/2015  3:03 PM  Dressing Change Due 08/26/15 08/19/2015  3:03 PM       Elliot Dallyiggs, Khalid Lacko Wright 08/19/2015, 3:04 PM

## 2015-08-20 ENCOUNTER — Inpatient Hospital Stay (HOSPITAL_COMMUNITY): Payer: MEDICAID

## 2015-08-20 DIAGNOSIS — R7989 Other specified abnormal findings of blood chemistry: Secondary | ICD-10-CM | POA: Diagnosis present

## 2015-08-20 DIAGNOSIS — E010 Iodine-deficiency related diffuse (endemic) goiter: Secondary | ICD-10-CM | POA: Diagnosis present

## 2015-08-20 DIAGNOSIS — E0500 Thyrotoxicosis with diffuse goiter without thyrotoxic crisis or storm: Secondary | ICD-10-CM | POA: Diagnosis present

## 2015-08-20 DIAGNOSIS — E1010 Type 1 diabetes mellitus with ketoacidosis without coma: Principal | ICD-10-CM

## 2015-08-20 DIAGNOSIS — R102 Pelvic and perineal pain: Secondary | ICD-10-CM | POA: Diagnosis present

## 2015-08-20 DIAGNOSIS — E109 Type 1 diabetes mellitus without complications: Secondary | ICD-10-CM | POA: Diagnosis present

## 2015-08-20 LAB — BASIC METABOLIC PANEL
Anion gap: 8 (ref 5–15)
BUN: 14 mg/dL (ref 6–20)
CO2: 25 mmol/L (ref 22–32)
CREATININE: 0.5 mg/dL (ref 0.44–1.00)
Calcium: 8.9 mg/dL (ref 8.9–10.3)
Chloride: 99 mmol/L — ABNORMAL LOW (ref 101–111)
GFR calc Af Amer: >60 mL/min (ref >60)
GLUCOSE: 386 mg/dL — AB (ref 65–99)
POTASSIUM: 4.5 mmol/L (ref 3.5–5.1)
Sodium: 132 mmol/L — ABNORMAL LOW (ref 135–145)

## 2015-08-20 LAB — CBC
HCT: 33.4 % — ABNORMAL LOW (ref 36.0–46.0)
Hemoglobin: 10.4 g/dL — ABNORMAL LOW (ref 12.0–15.0)
MCH: 22.9 pg — AB (ref 26.0–34.0)
MCHC: 31.1 g/dL (ref 30.0–36.0)
MCV: 73.6 fL — AB (ref 78.0–100.0)
PLATELETS: 145 10*3/uL — AB (ref 150–400)
RBC: 4.54 MIL/uL (ref 3.87–5.11)
RDW: 16.8 % — ABNORMAL HIGH (ref 11.5–15.5)
WBC: 3.8 10*3/uL — AB (ref 4.0–10.5)

## 2015-08-20 LAB — GLUCOSE, CAPILLARY
GLUCOSE-CAPILLARY: 229 mg/dL — AB (ref 65–99)
GLUCOSE-CAPILLARY: 236 mg/dL — AB (ref 65–99)
Glucose-Capillary: 180 mg/dL — ABNORMAL HIGH (ref 65–99)
Glucose-Capillary: 244 mg/dL — ABNORMAL HIGH (ref 65–99)
Glucose-Capillary: 405 mg/dL — ABNORMAL HIGH (ref 65–99)
Glucose-Capillary: 317 mg/dL — ABNORMAL HIGH (ref 65–99)
Glucose-Capillary: 222 mg/dL — ABNORMAL HIGH (ref 65–99)
Glucose-Capillary: 307 mg/dL — ABNORMAL HIGH (ref 65–99)

## 2015-08-20 LAB — MAGNESIUM: Magnesium: 1.7 mg/dL (ref 1.7–2.4)

## 2015-08-20 LAB — T4, FREE: Free T4: 1.94 ng/dL — ABNORMAL HIGH (ref 0.61–1.12)

## 2015-08-20 MED ORDER — INSULIN ASPART 100 UNIT/ML ~~LOC~~ SOLN
0.0000 [IU] | Freq: Three times a day (TID) | SUBCUTANEOUS | Status: DC
  Administered 2015-08-20 – 2015-08-21 (×4): 7 [IU] via SUBCUTANEOUS
  Administered 2015-08-22 (×2): 20 [IU] via SUBCUTANEOUS
  Administered 2015-08-23 (×2): 7 [IU] via SUBCUTANEOUS
  Administered 2015-08-23: 20 [IU] via SUBCUTANEOUS

## 2015-08-20 MED ORDER — LEVOFLOXACIN 750 MG PO TABS
750.0000 mg | ORAL_TABLET | Freq: Every day | ORAL | Status: DC
  Administered 2015-08-20 – 2015-08-22 (×3): 750 mg via ORAL
  Filled 2015-08-20 (×3): qty 1

## 2015-08-20 MED ORDER — PROPRANOLOL HCL ER 60 MG PO CP24
60.0000 mg | ORAL_CAPSULE | Freq: Every day | ORAL | Status: DC
  Administered 2015-08-20 – 2015-08-23 (×4): 60 mg via ORAL
  Filled 2015-08-20 (×4): qty 1

## 2015-08-20 MED ORDER — IOHEXOL 300 MG/ML  SOLN
100.0000 mL | Freq: Once | INTRAMUSCULAR | Status: AC | PRN
  Administered 2015-08-20: 100 mL via INTRAVENOUS

## 2015-08-20 MED ORDER — METRONIDAZOLE IN NACL 5-0.79 MG/ML-% IV SOLN
500.0000 mg | Freq: Three times a day (TID) | INTRAVENOUS | Status: DC
  Administered 2015-08-20 – 2015-08-21 (×3): 500 mg via INTRAVENOUS
  Filled 2015-08-20 (×5): qty 100

## 2015-08-20 MED ORDER — ZOLPIDEM TARTRATE 5 MG PO TABS
5.0000 mg | ORAL_TABLET | Freq: Once | ORAL | Status: AC
  Administered 2015-08-20: 5 mg via ORAL
  Filled 2015-08-20: qty 1

## 2015-08-20 MED ORDER — DIPHENHYDRAMINE HCL 12.5 MG/5ML PO ELIX
12.5000 mg | ORAL_SOLUTION | Freq: Four times a day (QID) | ORAL | Status: DC | PRN
  Administered 2015-08-20 – 2015-08-22 (×2): 12.5 mg via ORAL
  Filled 2015-08-20 (×3): qty 5

## 2015-08-20 MED ORDER — INSULIN DETEMIR 100 UNIT/ML ~~LOC~~ SOLN
22.0000 [IU] | Freq: Two times a day (BID) | SUBCUTANEOUS | Status: DC
  Filled 2015-08-20: qty 0.22

## 2015-08-20 MED ORDER — INSULIN DETEMIR 100 UNIT/ML ~~LOC~~ SOLN
24.0000 [IU] | Freq: Two times a day (BID) | SUBCUTANEOUS | Status: DC
  Administered 2015-08-20 – 2015-08-21 (×2): 24 [IU] via SUBCUTANEOUS
  Filled 2015-08-20 (×2): qty 0.24

## 2015-08-20 MED ORDER — IOHEXOL 300 MG/ML  SOLN
50.0000 mL | Freq: Once | INTRAMUSCULAR | Status: AC | PRN
  Administered 2015-08-20: 50 mL via ORAL

## 2015-08-20 MED ORDER — METHIMAZOLE 10 MG PO TABS
10.0000 mg | ORAL_TABLET | Freq: Two times a day (BID) | ORAL | Status: DC
  Administered 2015-08-20 – 2015-08-22 (×5): 10 mg via ORAL
  Filled 2015-08-20 (×6): qty 1

## 2015-08-20 MED ORDER — INSULIN ASPART 100 UNIT/ML ~~LOC~~ SOLN
12.0000 [IU] | Freq: Once | SUBCUTANEOUS | Status: AC
  Administered 2015-08-20: 12 [IU] via SUBCUTANEOUS

## 2015-08-20 MED ORDER — INSULIN ASPART 100 UNIT/ML ~~LOC~~ SOLN
0.0000 [IU] | Freq: Every day | SUBCUTANEOUS | Status: DC
  Administered 2015-08-20 – 2015-08-22 (×3): 4 [IU] via SUBCUTANEOUS

## 2015-08-20 NOTE — Progress Notes (Addendum)
[facility] PROGRESS NOTE  Jessica Tapia ZOX:[id number] DOB: 05/04/1986 DOA: 08/17/2015 PCP: No primary care provider on file.  Summary 08/18/15: I have seen and examined Jessica Tapia at bedside and reviewed her chart. Discussed with nursing. Jessica Tapia is a 30 year old female with a past medical history of diabetes type I, who came in with nausea, vomiting, diarrhea and abdominal pain and was found to have diabetic ketoacidosis with UTI/suggestion of pneumonia on chest x-ray. MRSA PCR is positive. She is tachycardic with low-grade fever hence question of sepsis. She ist still in DKA but demands to eat. She complains of abdominal pain/pleuritic chest pain. Will add Zithromax/Vanc/Zosyn, d/c Ceftriaxone, check HbA1c and Keep in SDU today. Will D/c Lovenox as patient has (Microcytic anemia). Will adjust pain medications as possible. 08/19/15: Now out of DKA. Hemoglobin A1c 14.2. Will transition to Levemir/SSI and adjust dose to optimize control. Patient also complains of persistent left-sided pleuritic chest pain. We'll therefore obtain CT chest without contrast to better evaluate lung parenchyma-likely pneumonia, hopefully no complications related to pneumonia. Patient hypokalemic/hypomagnesemia make/hypertensive. We'll replenish electrolytes and start Norvasc/hydralazine and reassess. There is element of pain contributing to high blood pressure. Electrolyte abnormalities likely related to GI loss from diarrhea. Patient is stable for transfer to MedSurg unit when bed becomes available. 08/20/15: Keeps refusing medications(pickes and chooses). No diarrhea. CT chest unrevealing except for thryomegaly. Tsh low. Patient now states that she was diagnosed of Graves' disease 2 years ago and was supposed to be on methimazole/propranolol but has not been taking the medications due to insurance issues.hyperthyroidism may explain some of her anxiety. Sugars not optimally controlled. Afebrile. Will deescalate antibiotics to  Levaquin oral/Flagyl(?pelvic abscess)/check free t4, gradually increase Levemir to optimize control, start Propranol/Methimazole, d/c Norvasc and obtain CT abdomen and pelvis to evaluate vaginal pain. Plan DKA (diabetic ketoacidoses) (HCC)(resolved)/diabetes mellitus type 1  Poorly controlled long-term, hemoglobin A1c 14.2.   Increase Levemir to 24 units bid  Continue ssi. Diarrhea/Nausea and vomiting/Abdominal pain/hypokalemia/hypomagnesemia  Diarrhea resolved  Replenish potassium/magnesium, give antiemetics as needed  D/c enteric precautions MRSA carrier/UTI (urinary tract infection)/PNA (pneumonia)/vaginal pain ?abscess  Change antibiotics to Levaquin/Flagyl  Ct abdomen/pelvis Graves' disease/Low tsh/thyromegaly  Check free t4  Propranolol  Methimazole Essential Htn  D/c Norvasc as patient will be on propranolol.  Hydralazine as needed. Pain control. Code Status: Full Code Family Communication: Patient at bedside Disposition Plan: ?Home early this week   Consultants:  None  Procedures:    Antibiotics:  Ceftriaxone 08/17/15>>08/18/15  Vanc 08/18/15>>08/20/15  Zosyn 08/18/15>>08/20/15  Zithromax 08/18/15>>08/20/15  Levaquin 08/20/15>>  Flagyl 08/20/15>>  HPI/Subjective: Complaints of Vaginal pain  Objective: Filed Vitals:   08/19/15 2055 08/20/15 0507  BP: 161/78 166/79  Pulse:  85  Temp:  99 F (37.2 C)  Resp:  16    Intake/Output Summary (Last 24 hours) at 08/20/15 0957 Last data filed at 08/20/15 0918  Gross per 24 hour  Intake 2931.25 ml  Output    202 ml  Net 2729.25 ml   Filed Weights   08/18/15 0011 08/18/15 0500  Weight: 65.4 kg (144 lb 2.9 oz) 65.4 kg (144 lb 2.9 oz)    Exam:   General:  Comfortable at rest.  Cardiovascular: S1-S2 normal. No murmurs. Pulse regular.  Respiratory: Good air entry bilaterally. No rhonchi or rales.  Abdomen: Soft and nontender. Normal bowel sounds. No organomegaly. Tenderness to palpation right vulvular  earl;ier(examined patient with nurse Chaperon in attendance)  Musculoskeletal: No pedal edema . Left above elbow amputation  Neurological: Intact  Data Reviewed: Basic Metabolic Panel:  Recent Labs Lab 08/18/15 0949 08/18/15 1808 08/18/15 2200 08/19/15 0341 08/20/15 0445  NA 136 135 137 138 132*  K 3.6 3.7 4.0 3.4* 4.5  CL 112* 110 108 110 99*  CO2 16* 18* 21* 20* 25  GLUCOSE 179* 205* 200* 121* 386*  BUN 21* CREATININE 0.52 0.94 0.78 0.43* 0.50  CALCIUM 7.7* 8.3* 8.4* 8.8* 8.9  MG  --   --   --  1.4* 1.7  PHOS  --   --   --  3.8  --    Liver Function Tests:  Recent Labs Lab 08/17/15 2128 08/18/15 0328 08/19/15 0341  AST 115* 175* 104*  ALT 37 62* 73*  ALKPHOS 203* 236* 207*  BILITOT 1.3* 0.7 0.1*  PROT 7.1 6.1* 6.2*  ALBUMIN 3.3* 2.8* 2.8*    Recent Labs Lab 08/18/15 2200  LIPASE 23  AMYLASE 58   No results for input(s): AMMONIA in the last 168 hours. CBC:  Recent Labs Lab 08/17/15 2128 08/18/15 0328 08/19/15 0341 08/20/15 0445  WBC 7.4 8.8 4.2 3.8*  NEUTROABS 4.4  --  1.5*  --   HGB 11.5* 10.5* 9.8* 10.4*  HCT 38.0 32.8* 30.9* 33.4*  MCV 75.0* 72.9* 73.2* 73.6*  PLT 187 176 152 145*   Cardiac Enzymes: No results for input(s): CKTOTAL, CKMB, CKMBINDEX, TROPONINI in the last 168 hours. BNP (last 3 results) No results for input(s): BNP in the last 8760 hours.  ProBNP (last 3 results) No results for input(s): PROBNP in the last 8760 hours.  CBG:  Recent Labs Lab 08/19/15 0903 08/19/15 1319 08/19/15 1629 08/19/15 2051 08/20/15 0755  GLUCAP 268* 392* 373* 295* 244*    Recent Results (from the past 240 hour(s))  Urine culture     Status: None   Collection Time: 08/17/15 10:05 PM  Result Value Ref Range Status   Specimen Description URINE, CLEAN CATCH  Final   Special Requests Immunocompromised  Final   Culture   Final    MULTIPLE SPECIES PRESENT, SUGGEST RECOLLECTION Performed at [facility]    Report Status  08/19/2015 FINAL  Final  MRSA PCR Screening     Status: Abnormal   Collection Time: 08/18/15 12:05 AM  Result Value Ref Range Status   MRSA by PCR POSITIVE (A) NEGATIVE Final    Comment:        The GeneXpert MRSA Assay (FDA approved for NASAL specimens only), is one component of a comprehensive MRSA colonization surveillance program. It is not intended to diagnose MRSA infection nor to guide or monitor treatment for MRSA infections. RESULT CALLED TO, READ BACK BY AND VERIFIED WITH: A. [facility] RN AT 0340 ON 03.03.17 BY SHUEA      Studies: Ct Chest Wo Contrast  08/19/2015  CLINICAL DATA:  Left-sided chest pain. EXAM: CT CHEST WITHOUT CONTRAST TECHNIQUE: Multidetector CT imaging of the chest was performed following the standard protocol without IV contrast. COMPARISON:  None. FINDINGS: No pulmonary nodules, masses, or infiltrates. There is mucus within the trachea. There is enlargement of the thyroid with no focal masses. Triangular soft tissue in the anterior mediastinum is consistent with residual thymus. The borders are concave with no convexity. Evaluation of the hila is limited without contrast but no hilar adenopathy is identified. The mediastinal nodes are within normal limits as well. A right PICC line terminates just in the right side of the atrium. The axilla and base of  neck are otherwise normal. The chest wall is unremarkable. The esophagus is within normal limits. The heart size is normal. Central great vessels are normal in caliber. No effusions. IMPRESSION: No acute abnormality to explain the patient's symptoms is identified. Thyromegaly with no focal masses. Electronically Signed   By: Gerome Sam III M.D   On: 08/19/2015 16:23    Scheduled Meds: . amLODipine  5 mg Oral Daily  . Chlorhexidine Gluconate Cloth  6 each Topical Q0600  . Influenza vac split quadrivalent PF  0.5 mL Intramuscular Tomorrow-1000  . insulin aspart  0-9 Units Subcutaneous TID WC  . insulin detemir   22 Units Subcutaneous BID  . levofloxacin  750 mg Oral Daily  . mupirocin ointment  1 application Nasal BID  . nicotine  21 mg Transdermal Daily  . pantoprazole  40 mg Oral Q1200  . sodium chloride flush  3 mL Intravenous Q12H   Continuous Infusions: . 0.9 % NaCl with KCl 20 mEq / L 75 mL/hr at 08/19/15 1543     Time spent: 25 minutes    Honor Frison  [facility] Pager [phone]. If 7PM-7AM, please contact night-coverage at [email or web address], password [email or web address] 08/20/2015, 9:57 AM  LOS: 3 days

## 2015-08-20 NOTE — Progress Notes (Signed)
Dr. Venetia Constableanga aware via phone pt's CBG 405 at noon. Pt asymptomatic. MD entered new orders into EPIC. Beth RN, primary nurse aware.

## 2015-08-20 NOTE — Progress Notes (Signed)
Pharmacy IV to PO conversion  The patient is receiving diphenhydramine by the intravenous route.  Based on the following criteria approved by the Pharmacy and Therapeutics Committee and the Medical Executive Committee, the medication is being converted to the equivalent oral dose form.  Diphenhydramine is not prescribed to treat or prevent a severe allergic reaction  Diphenhydramine is not prescribed as premedication prior to receiving blood product, biologic medication, antimicrobial, or chemotherapy agent  The patient has tolerated at least one dose of an oral or enteral medication  The patient has no evidence of active gastrointestinal bleeding or impaired GI absorption (gastrectomy, short bowel, patient on TNA or NPO).  The patient is not undergoing procedural sedation   If you have any questions about this conversion, please contact the Pharmacy Department (ext [phone]).  Thank you.  Bernadene Personrew Zalia Hautala, PharmD Pager: [phone] 08/20/2015, 4:29 PM

## 2015-08-21 LAB — CBC
HCT: 34.7 % — ABNORMAL LOW (ref 36.0–46.0)
HEMOGLOBIN: 10.8 g/dL — AB (ref 12.0–15.0)
MCH: 22.9 pg — ABNORMAL LOW (ref 26.0–34.0)
MCHC: 31.1 g/dL (ref 30.0–36.0)
MCV: 73.5 fL — ABNORMAL LOW (ref 78.0–100.0)
PLATELETS: 160 10*3/uL (ref 150–400)
RBC: 4.72 MIL/uL (ref 3.87–5.11)
RDW: 16.9 % — AB (ref 11.5–15.5)
WBC: 3.7 10*3/uL — ABNORMAL LOW (ref 4.0–10.5)

## 2015-08-21 LAB — GLUCOSE, CAPILLARY
GLUCOSE-CAPILLARY: 244 mg/dL — AB (ref 65–99)
GLUCOSE-CAPILLARY: 333 mg/dL — AB (ref 65–99)
Glucose-Capillary: 247 mg/dL — ABNORMAL HIGH (ref 65–99)
Glucose-Capillary: 250 mg/dL — ABNORMAL HIGH (ref 65–99)
Glucose-Capillary: 227 mg/dL — ABNORMAL HIGH (ref 65–99)
Glucose-Capillary: 243 mg/dL — ABNORMAL HIGH (ref 65–99)

## 2015-08-21 LAB — COMPREHENSIVE METABOLIC PANEL
ALBUMIN: 2.9 g/dL — AB (ref 3.5–5.0)
ALT: 46 U/L (ref 14–54)
ANION GAP: 7 (ref 5–15)
AST: 31 U/L (ref 15–41)
Alkaline Phosphatase: 186 U/L — ABNORMAL HIGH (ref 38–126)
BUN: 15 mg/dL (ref 6–20)
CHLORIDE: 101 mmol/L (ref 101–111)
CO2: 27 mmol/L (ref 22–32)
Calcium: 8.8 mg/dL — ABNORMAL LOW (ref 8.9–10.3)
Creatinine, Ser: 0.46 mg/dL (ref 0.44–1.00)
GFR calc Af Amer: >60 mL/min (ref >60)
GFR calc non Af Amer: >60 mL/min (ref >60)
GLUCOSE: 250 mg/dL — AB (ref 65–99)
POTASSIUM: 4.1 mmol/L (ref 3.5–5.1)
SODIUM: 135 mmol/L (ref 135–145)
Total Bilirubin: 0.2 mg/dL — ABNORMAL LOW (ref 0.3–1.2)
Total Protein: 6.5 g/dL (ref 6.5–8.1)

## 2015-08-21 LAB — LEGIONELLA ANTIGEN, URINE

## 2015-08-21 LAB — HEMOGLOBIN A1C
Hgb A1c MFr Bld: 13.7 % — ABNORMAL HIGH (ref 4.8–5.6)
MEAN PLASMA GLUCOSE: 346 mg/dL

## 2015-08-21 MED ORDER — FLUCONAZOLE 200 MG PO TABS
200.0000 mg | ORAL_TABLET | Freq: Once | ORAL | Status: AC
  Administered 2015-08-21: 200 mg via ORAL
  Filled 2015-08-21: qty 1

## 2015-08-21 MED ORDER — INSULIN DETEMIR 100 UNIT/ML ~~LOC~~ SOLN
28.0000 [IU] | Freq: Two times a day (BID) | SUBCUTANEOUS | Status: DC
Start: 2015-08-21 — End: 2015-08-22
  Administered 2015-08-21 – 2015-08-22 (×2): 28 [IU] via SUBCUTANEOUS
  Filled 2015-08-21 (×4): qty 0.28

## 2015-08-21 MED ORDER — POLYETHYLENE GLYCOL 3350 17 G PO PACK
17.0000 g | PACK | Freq: Every day | ORAL | Status: DC
  Administered 2015-08-21: 17 g via ORAL
  Filled 2015-08-21 (×2): qty 1

## 2015-08-21 MED ORDER — FLUCONAZOLE 100 MG PO TABS
100.0000 mg | ORAL_TABLET | Freq: Every day | ORAL | Status: DC
  Administered 2015-08-22 – 2015-08-23 (×2): 100 mg via ORAL
  Filled 2015-08-21 (×2): qty 1

## 2015-08-21 NOTE — Progress Notes (Signed)
Inpatient Diabetes Program Recommendations  AACE/ADA: New Consensus Statement on Inpatient Glycemic Control (2015)  Target Ranges:  Prepandial:   less than 140 mg/dL      Peak postprandial:   less than 180 mg/dL (1-2 hours)      Critically ill patients:  140 - 180 mg/dL   Review of Glycemic Control  Results for Jessica Tapia, Jessica Tapia (MRN [id number]) as of 08/21/2015 12:34  Ref. Range 08/20/2015 16:10 08/20/2015 21:43 08/20/2015 23:51 08/21/2015 07:27 08/21/2015 10:45  Glucose-Capillary Latest Ref Range: 65-99 mg/dL 782222 (H) 956307 (H) 213229 (H) 247 (H) 250 (H)  Results for Jessica Tapia, Jessica Tapia (MRN [id number]) as of 08/21/2015 12:34  Ref. Range 08/20/2015 04:45 08/21/2015 05:49  Glucose Latest Ref Range: 65-99 mg/dL 629386 (H) 528250 (H)   Uncontrolled blood sugars.  Inpatient Diabetes Program Recommendations:    Decrease Novolog to sensitive tidwc and hs, since pt is Type 1. Add meal coverage insulin - Novolog 8 units tidwc if pt eats > 50% meal. [facility] within week of discharge for uncontrolled DM. [facility]. To talk with pt this afternoon.  Thank you. Ailene Ardshonda Jaiceon Collister, RD, LDN, CDE Inpatient Diabetes Coordinator [phone]

## 2015-08-21 NOTE — Progress Notes (Signed)
Utilization review completed.  

## 2015-08-21 NOTE — Progress Notes (Signed)
[facility] PROGRESS NOTE  Leane Paraiffany Delsignore ZOX:[id number] DOB: 11/16/1985 DOA: 08/17/2015 PCP: No primary care provider on file.  Summary 08/18/15: I have seen and examined Ms. Dierks at bedside and reviewed her chart. Discussed with nursing. Leane Paraiffany Palin is a 30 year old female with a past medical history of diabetes type I, who came in with nausea, vomiting, diarrhea and abdominal pain and was found to have diabetic ketoacidosis with UTI/suggestion of pneumonia on chest x-ray. MRSA PCR is positive. She is tachycardic with low-grade fever hence question of sepsis. She ist still in DKA but demands to eat. She complains of abdominal pain/pleuritic chest pain. Will add Zithromax/Vanc/Zosyn, d/c Ceftriaxone, check HbA1c and Keep in SDU today. Will D/c Lovenox as patient has (Microcytic anemia). Will adjust pain medications as possible. 08/19/15: Now out of DKA. Hemoglobin A1c 14.2. Will transition to Levemir/SSI and adjust dose to optimize control. Patient also complains of persistent left-sided pleuritic chest pain. We'll therefore obtain CT chest without contrast to better evaluate lung parenchyma-likely pneumonia, hopefully no complications related to pneumonia. Patient hypokalemic/hypomagnesemia make/hypertensive. We'll replenish electrolytes and start Norvasc/hydralazine and reassess. There is element of pain contributing to high blood pressure. Electrolyte abnormalities likely related to GI loss from diarrhea. Patient is stable for transfer to MedSurg unit when bed becomes available. 08/20/15: Keeps refusing medications(pickes and chooses). No diarrhea. CT chest unrevealing except for thryomegaly. Tsh low. Patient now states that she was diagnosed of Graves' disease 2 years ago and was supposed to be on methimazole/propranolol but has not been taking the medications due to insurance issues.hyperthyroidism may explain some of her anxiety. Sugars not optimally controlled. Afebrile. Will deescalate antibiotics to  Levaquin oral/Flagyl(?pelvic abscess)/check free t4, gradually increase Levemir to optimize control, start Propranol/Methimazole, d/c Norvasc and obtain CT abdomen and pelvis to evaluate vaginal pain. 08/21/15: CT abdomen pelvis does not suggest abscess- "Mildly increased stool throughout colon and significant food debris in stomach. Wall thickening of distal esophagus, can be due to reflux or esophagitis, tumor unlikely at this age though not excluded by CT. No other definite intra-abdominal or intrapelvic abnormalities identified". Sugars better controlled but not optimally. Patient is more calm today- hopefully tapazole kicking in. Will d/c Flagyl since no pelvic abscess, start fluconazole as patient may just have candidiasis, add Miralax for constipation, and Increase Levemir to 28 units bid. Hopefully d/c home tomorrow if continues to do well. Plan DKA (diabetic ketoacidoses) (HCC)(resolved)/diabetes mellitus type 1  Poorly controlled long-term, hemoglobin A1c 14.2.   Increase Levemir to 28 units bid  Continue ssi. Diarrhea/Nausea and vomiting/Abdominal pain/hypokalemia/hypomagnesemia  Diarrhea resolved  Replenish potassium/magnesium, give antiemetics as needed MRSA carrier/UTI (urinary tract infection)/PNA (pneumonia)/vaginal pain ?abscess  Continue Levaquin to comp[lete 7 days of antibiotics  D/c Flagyl  Fluconazole Graves' disease/Low tsh/thyromegaly  Propranolol  Methimazole Essential Htn  BP better controlled  Continue Propranolol, Hydralazine as needed. Pain control. Code Status: Full Code Family Communication: Patient at bedside Disposition Plan: ?Home tomorrow   Consultants:  None  Procedures:    Antibiotics:  Ceftriaxone 08/17/15>>08/18/15  Vanc 08/18/15>>08/20/15  Zosyn 08/18/15>>08/20/15  Zithromax 08/18/15>>08/20/15  Levaquin 08/20/15>>  Flagyl 08/20/15>>08/21/15  Fluconazole 08/21/15>>  HPI/Subjective: Feels better. Less itching vaginal  area.  Objective: Filed Vitals:   08/20/15 2138 08/21/15 0558  BP: 127/79 140/98  Pulse: 94 101  Temp: 98.5 F (36.9 C) 98.1 F (36.7 C)  Resp: 16 16    Intake/Output Summary (Last 24 hours) at 08/21/15 1042 Last data filed at 08/21/15 0600  Gross per 24 hour  Intake   2100 ml  Output      0 ml  Net   2100 ml   Filed Weights   08/18/15 0011 08/18/15 0500  Weight: 65.4 kg (144 lb 2.9 oz) 65.4 kg (144 lb 2.9 oz)    Exam:   General:  Comfortable at rest.  Cardiovascular: S1-S2 normal. No murmurs. Pulse regular.  Respiratory: Good air entry bilaterally. No rhonchi or rales.  Abdomen: Soft and nontender. Normal bowel sounds. No organomegaly.  Musculoskeletal: No pedal edema   Neurological: Intact  Data Reviewed: Basic Metabolic Panel:  Recent Labs Lab 08/18/15 1808 08/18/15 2200 08/19/15 0341 08/20/15 0445 08/21/15 0549  NA 135 137 138 132* 135  K 3.7 4.0 3.4* 4.5 4.1  CL 110 108 110 99* 101  CO2 18* 21* 20* 25 27  GLUCOSE 205* 200* 121* 386* 250*  BUN CREATININE 0.94 0.78 0.43* 0.50 0.46  CALCIUM 8.3* 8.4* 8.8* 8.9 8.8*  MG  --   --  1.4* 1.7  --   PHOS  --   --  3.8  --   --    Liver Function Tests:  Recent Labs Lab 08/17/15 2128 08/18/15 0328 08/19/15 0341 08/21/15 0549  AST 115* 175* 104* 31  ALT 37 62* 73* 46  ALKPHOS 203* 236* 207* 186*  BILITOT 1.3* 0.7 0.1* 0.2*  PROT 7.1 6.1* 6.2* 6.5  ALBUMIN 3.3* 2.8* 2.8* 2.9*    Recent Labs Lab 08/18/15 2200  LIPASE 23  AMYLASE 58   No results for input(s): AMMONIA in the last 168 hours. CBC:  Recent Labs Lab 08/17/15 2128 08/18/15 0328 08/19/15 0341 08/20/15 0445 08/21/15 0549  WBC 7.4 8.8 4.2 3.8* 3.7*  NEUTROABS 4.4  --  1.5*  --   --   HGB 11.5* 10.5* 9.8* 10.4* 10.8*  HCT 38.0 32.8* 30.9* 33.4* 34.7*  MCV 75.0* 72.9* 73.2* 73.6* 73.5*  PLT 187 176 152 145* 160   Cardiac Enzymes: No results for input(s): CKTOTAL, CKMB, CKMBINDEX, TROPONINI in the last 168  hours. BNP (last 3 results) No results for input(s): BNP in the last 8760 hours.  ProBNP (last 3 results) No results for input(s): PROBNP in the last 8760 hours.  CBG:  Recent Labs Lab 08/20/15 1531 08/20/15 1610 08/20/15 2143 08/20/15 2351 08/21/15 0727  GLUCAP 236* 222* 307* 229* 247*    Recent Results (from the past 240 hour(s))  Urine culture     Status: None   Collection Time: 08/17/15 10:05 PM  Result Value Ref Range Status   Specimen Description URINE, CLEAN CATCH  Final   Special Requests Immunocompromised  Final   Culture   Final    MULTIPLE SPECIES PRESENT, SUGGEST RECOLLECTION Performed at [facility]    Report Status 08/19/2015 FINAL  Final  MRSA PCR Screening     Status: Abnormal   Collection Time: 08/18/15 12:05 AM  Result Value Ref Range Status   MRSA by PCR POSITIVE (A) NEGATIVE Final    Comment:        The GeneXpert MRSA Assay (FDA approved for NASAL specimens only), is one component of a comprehensive MRSA colonization surveillance program. It is not intended to diagnose MRSA infection nor to guide or monitor treatment for MRSA infections. RESULT CALLED TO, READ BACK BY AND VERIFIED WITH: A. [facility] RN AT 0340 ON 03.03.17 BY SHUEA      Studies: Ct Chest Wo Contrast  08/19/2015  CLINICAL DATA:  Left-sided chest  pain. EXAM: CT CHEST WITHOUT CONTRAST TECHNIQUE: Multidetector CT imaging of the chest was performed following the standard protocol without IV contrast. COMPARISON:  None. FINDINGS: No pulmonary nodules, masses, or infiltrates. There is mucus within the trachea. There is enlargement of the thyroid with no focal masses. Triangular soft tissue in the anterior mediastinum is consistent with residual thymus. The borders are concave with no convexity. Evaluation of the hila is limited without contrast but no hilar adenopathy is identified. The mediastinal nodes are within normal limits as well. A right PICC line terminates just in the right  side of the atrium. The axilla and base of neck are otherwise normal. The chest wall is unremarkable. The esophagus is within normal limits. The heart size is normal. Central great vessels are normal in caliber. No effusions. IMPRESSION: No acute abnormality to explain the patient's symptoms is identified. Thyromegaly with no focal masses. Electronically Signed   By: Gerome Sam III M.D   On: 08/19/2015 16:23   Ct Abdomen Pelvis W Contrast  08/20/2015  CLINICAL DATA:  Nausea, vomiting, diarrhea, abdominal pain, diabetic ketoacidosis, UTI, question pneumonia on chest radiograph, vaginal abscess, vaginal pain, history diabetes mellitus, benign essential hypertension EXAM: CT ABDOMEN AND PELVIS WITH CONTRAST TECHNIQUE: Multidetector CT imaging of the abdomen and pelvis was performed using the standard protocol following bolus administration of intravenous contrast. Sagittal and coronal MPR images reconstructed from axial data set. CONTRAST:  50mL OMNIPAQUE IOHEXOL 300 MG/ML SOLN PO, OMNIPAQUE IOHEXOL 300 MG/ML SOLN IV COMPARISON:  05/31/2015 FINDINGS: Lung bases clear. Gallbladder surgically absent. Liver, spleen, pancreas, kidneys, and adrenal glands normal appearance. Few scattered respiratory motion artifacts. Normal appendix in RIGHT pelvis. Bladder, ureters, uterus and adnexa unremarkable. Thickening of distal esophagus. Oral contrast with reportedly administered but no bowel opacification is seen. Prominent food debris in stomach. Increased stool throughout colon. Bowel loops otherwise unremarkable. No definite mass, adenopathy, free air, free fluid, or inflammatory process. New line specifically, no vaginal or perivaginal fluid collections identified. Scattered normal sized mesenteric, retroperitoneal and inguinal nodes. No hernia seen. Sclerotic focus LEFT ischium unchanged question bone island. No acute osseous findings. IMPRESSION: Mildly increased stool throughout colon and significant food  debris in stomach. Wall thickening of distal esophagus, can be due to reflux or esophagitis, tumor unlikely at this age though not excluded by CT. No other definite intra-abdominal or intrapelvic abnormalities identified. Electronically Signed   By: Ulyses Southward M.D.   On: 08/20/2015 14:30    Scheduled Meds: . Chlorhexidine Gluconate Cloth  6 each Topical Q0600  . [START ON 08/22/2015] fluconazole  100 mg Oral Daily  . fluconazole  200 mg Oral Once  . Influenza vac split quadrivalent PF  0.5 mL Intramuscular Tomorrow-1000  . insulin aspart  0-20 Units Subcutaneous TID WC  . insulin aspart  0-5 Units Subcutaneous QHS  . insulin detemir  24 Units Subcutaneous BID  . levofloxacin  750 mg Oral Daily  . methimazole  10 mg Oral BID  . mupirocin ointment  1 application Nasal BID  . nicotine  21 mg Transdermal Daily  . pantoprazole  40 mg Oral Q1200  . polyethylene glycol  17 g Oral Daily  . propranolol ER  60 mg Oral Daily  . sodium chloride flush  3 mL Intravenous Q12H   Continuous Infusions:    Time spent: 25 minutes    Cohen Doleman  [facility] Pager [phone]. If 7PM-7AM, please contact night-coverage at [email or web address], password [facility] 08/21/2015, 10:42 AM  LOS: 4  days

## 2015-08-22 ENCOUNTER — Encounter (HOSPITAL_COMMUNITY): Payer: Self-pay | Admitting: *Deleted

## 2015-08-22 DIAGNOSIS — E109 Type 1 diabetes mellitus without complications: Secondary | ICD-10-CM

## 2015-08-22 LAB — GLUCOSE, CAPILLARY
GLUCOSE-CAPILLARY: 157 mg/dL — AB (ref 65–99)
GLUCOSE-CAPILLARY: 361 mg/dL — AB (ref 65–99)
GLUCOSE-CAPILLARY: 98 mg/dL (ref 65–99)
GLUCOSE-CAPILLARY: 496 mg/dL — AB (ref 65–99)
GLUCOSE-CAPILLARY: 339 mg/dL — AB (ref 65–99)

## 2015-08-22 LAB — CBC
HCT: 37.4 % (ref 36.0–46.0)
Hemoglobin: 11.7 g/dL — ABNORMAL LOW (ref 12.0–15.0)
MCH: 22.9 pg — ABNORMAL LOW (ref 26.0–34.0)
MCHC: 31.3 g/dL (ref 30.0–36.0)
MCV: 73.3 fL — ABNORMAL LOW (ref 78.0–100.0)
PLATELETS: 167 10*3/uL (ref 150–400)
RBC: 5.1 MIL/uL (ref 3.87–5.11)
RDW: 17 % — AB (ref 11.5–15.5)
WBC: 4.4 10*3/uL (ref 4.0–10.5)

## 2015-08-22 LAB — BASIC METABOLIC PANEL
ANION GAP: 9 (ref 5–15)
BUN: 17 mg/dL (ref 6–20)
CALCIUM: 9.1 mg/dL (ref 8.9–10.3)
CO2: 26 mmol/L (ref 22–32)
CREATININE: 0.39 mg/dL — AB (ref 0.44–1.00)
Chloride: 101 mmol/L (ref 101–111)
Glucose, Bld: 117 mg/dL — ABNORMAL HIGH (ref 65–99)
Potassium: 4 mmol/L (ref 3.5–5.1)
Sodium: 136 mmol/L (ref 135–145)

## 2015-08-22 MED ORDER — ZOLPIDEM TARTRATE 5 MG PO TABS
5.0000 mg | ORAL_TABLET | Freq: Once | ORAL | Status: AC
  Administered 2015-08-22: 5 mg via ORAL
  Filled 2015-08-22: qty 1

## 2015-08-22 MED ORDER — PROMETHAZINE HCL 25 MG/ML IJ SOLN
12.5000 mg | Freq: Once | INTRAMUSCULAR | Status: AC
  Administered 2015-08-22: 12.5 mg via INTRAVENOUS
  Filled 2015-08-22: qty 1

## 2015-08-22 MED ORDER — INSULIN DETEMIR 100 UNIT/ML ~~LOC~~ SOLN
30.0000 [IU] | Freq: Two times a day (BID) | SUBCUTANEOUS | Status: DC
  Administered 2015-08-22 – 2015-08-23 (×2): 30 [IU] via SUBCUTANEOUS
  Filled 2015-08-22 (×4): qty 0.3

## 2015-08-22 MED ORDER — LEVOFLOXACIN 500 MG PO TABS
500.0000 mg | ORAL_TABLET | Freq: Every day | ORAL | Status: DC
  Administered 2015-08-23: 500 mg via ORAL
  Filled 2015-08-22: qty 1

## 2015-08-22 MED ORDER — METHIMAZOLE 5 MG PO TABS
5.0000 mg | ORAL_TABLET | Freq: Two times a day (BID) | ORAL | Status: DC
Start: 2015-08-22 — End: 2015-08-23
  Administered 2015-08-22 – 2015-08-23 (×2): 5 mg via ORAL
  Filled 2015-08-22 (×3): qty 1

## 2015-08-22 MED ORDER — LOPERAMIDE HCL 2 MG PO CAPS
4.0000 mg | ORAL_CAPSULE | Freq: Once | ORAL | Status: AC
  Administered 2015-08-22: 4 mg via ORAL
  Filled 2015-08-22: qty 2

## 2015-08-22 NOTE — Progress Notes (Signed)
Report given to Esther,RN. Patient to transfer to [address]. Luther Parodyaitlin Lum KeasFoecking,RN

## 2015-08-22 NOTE — Progress Notes (Signed)
Patient found to have staff members portable fan at her bedside. Loel DubonnetFan had staff members name written on the bottom but name had been marked out. When patient asked where she got fan she became very upset and tearful and said she was going to leave. Patient still had PICC line in arm and attempted to leave. RN told patient she could not leave with PICC line in place and RN called IV team to remove. MD paged to make aware of patients current intention of leaving.Security called due to patient being loud at nurses station refusing to return to her room,and disrupting other patients. House coverage called to speak to patient per patient request. Patient to be transferred to [address] [place]. Upon transporting patient to [address] [place] nursing staff noticed patient had four portable fans in her belongings.

## 2015-08-22 NOTE — Progress Notes (Signed)
[facility] PROGRESS NOTE  Jessica Tapia ZOX:[id number] DOB: 1985/08/11 DOA: 08/17/2015 PCP: No primary care provider on file.  Summary Jessica Tapia is a pleasant 30 year old female with diabetes type I poorly controlled hemoglobin A1c 13.7/Graves' disease causing hyperthyroidism patient off methimazole/propranolol for the last 2 years/essential hypertension/status post left above elbow amputation, who came in with nausea, vomiting, diarrhea and abdominal pain and was found to have diabetic ketoacidosis with UTI/suggestion of pneumonia on chest x-ray and sepsis like picture. Patient also noted to have hyperthyroidism with low TSH and high free T4. Her MRSA PCR was positive. Her workup included CT chest/CT abdomen and pelvis with unremarkable findings. There was concern for valvular abscess which was not seen on CT. Patient was placed on DKA protocol with improvement. She was treated as if septic with broad-spectrum antibiotics which have been narrowed to Levaquin/fluconazole(?vaginal candidiasis), was started on propranolol/methimazole, and she is now on Levemir/SSI and sugars are showing improvement. Today she is concerned about nausea and diarrhea. She will receive Imodium/Zofran as needed and can hopefully discharge home in the next 24-48 hours. Compliance with medications is an ongoing issue and she will need assistance getting medication is in the area(moved from [place] recently). Plan DKA (diabetic ketoacidoses) (HCC)(resolved)/diabetes mellitus type 1  Poorly controlled long-term, hemoglobin A1c 14.2.   Increase Levemir to 30 units bid  Continue ssi. Diarrhea/Nausea and vomiting/Abdominal pain/hypokalemia/hypomagnesemia  Imodium  Replenish potassium/magnesium, give antiemetics as needed MRSA carrier/UTI (urinary tract infection)/PNA (pneumonia)/vaginal pain ?yeast  Continue Levaquin to complete 7 days of antibiotics  Continue Fluconazole Graves' disease/Low  tsh/thyromegaly  Propranolol  Methimazole  Needs follow up with Endocrinology outpatient Essential Htn  BP better controlled  Continue Propranolol, Hydralazine as needed. Pain control. Code Status: Full Code Family Communication: Patient at bedside Disposition Plan: ?Home tomorrow   Consultants:  None  Procedures:    Antibiotics:  Ceftriaxone 08/17/15>>08/18/15  Vanc 08/18/15>>08/20/15  Zosyn 08/18/15>>08/20/15  Zithromax 08/18/15>>08/20/15  Levaquin 08/20/15>>  Flagyl 08/20/15>>08/21/15  Fluconazole 08/21/15>>  HPI/Subjective: Complains of diarrhea.  Objective: Filed Vitals:   08/22/15 0532 08/22/15 1337  BP: 129/95 126/84  Pulse: 106 93  Temp: 97.7 F (36.5 C) 98.2 F (36.8 C)  Resp: 18 18    Intake/Output Summary (Last 24 hours) at 08/22/15 1433 Last data filed at 08/22/15 0952  Gross per 24 hour  Intake   1003 ml  Output      0 ml  Net   1003 ml   Filed Weights   08/18/15 0011 08/18/15 0500  Weight: 65.4 kg (144 lb 2.9 oz) 65.4 kg (144 lb 2.9 oz)    Exam:   General:  Comfortable at rest.  Cardiovascular: S1-S2 normal. No murmurs. Pulse regular.  Respiratory: Good air entry bilaterally. No rhonchi or rales.  Abdomen: Soft and nontender. Normal bowel sounds. No organomegaly.  Musculoskeletal: No pedal edema   Neurological: Intact  Data Reviewed: Basic Metabolic Panel:  Recent Labs Lab 08/18/15 2200 08/19/15 0341 08/20/15 0445 08/21/15 0549 08/22/15 0508  NA 137 138 132* 135 136  K 4.0 3.4* 4.5 4.1 4.0  CL 108 110 99* 101 101  CO2 21* 20* GLUCOSE 200* 121* 386* 250* 117*  BUN CREATININE 0.78 0.43* 0.50 0.46 0.39*  CALCIUM 8.4* 8.8* 8.9 8.8* 9.1  MG  --  1.4* 1.7  --   --   PHOS  --  3.8  --   --   --    Liver Function  Tests:  Recent Labs Lab 08/17/15 2128 08/18/15 0328 08/19/15 0341 08/21/15 0549  AST 115* 175* 104* 31  ALT 37 62* 73* 46  ALKPHOS 203* 236* 207* 186*  BILITOT 1.3* 0.7 0.1* 0.2*  PROT  7.1 6.1* 6.2* 6.5  ALBUMIN 3.3* 2.8* 2.8* 2.9*    Recent Labs Lab 08/18/15 2200  LIPASE 23  AMYLASE 58   No results for input(s): AMMONIA in the last 168 hours. CBC:  Recent Labs Lab 08/17/15 2128 08/18/15 0328 08/19/15 0341 08/20/15 0445 08/21/15 0549 08/22/15 0508  WBC 7.4 8.8 4.2 3.8* 3.7* 4.4  NEUTROABS 4.4  --  1.5*  --   --   --   HGB 11.5* 10.5* 9.8* 10.4* 10.8* 11.7*  HCT 38.0 32.8* 30.9* 33.4* 34.7* 37.4  MCV 75.0* 72.9* 73.2* 73.6* 73.5* 73.3*  PLT 187 176 152 145* 160 167   Cardiac Enzymes: No results for input(s): CKTOTAL, CKMB, CKMBINDEX, TROPONINI in the last 168 hours. BNP (last 3 results) No results for input(s): BNP in the last 8760 hours.  ProBNP (last 3 results) No results for input(s): PROBNP in the last 8760 hours.  CBG:  Recent Labs Lab 08/21/15 1627 08/21/15 2129 08/22/15 0102 08/22/15 0730 08/22/15 1144  GLUCAP 243* 333* 157* 361* 98    Recent Results (from the past 240 hour(s))  Urine culture     Status: None   Collection Time: 08/17/15 10:05 PM  Result Value Ref Range Status   Specimen Description URINE, CLEAN CATCH  Final   Special Requests Immunocompromised  Final   Culture   Final    MULTIPLE SPECIES PRESENT, SUGGEST RECOLLECTION [facility]    Report Status 08/19/2015 FINAL  Final  MRSA PCR Screening     Status: Abnormal   Collection Time: 08/18/15 12:05 AM  Result Value Ref Range Status   MRSA by PCR POSITIVE (A) NEGATIVE Final    Comment:        The GeneXpert MRSA Assay (FDA approved for NASAL specimens only), is one component of a comprehensive MRSA colonization surveillance program. It is not intended to diagnose MRSA infection nor to guide or monitor treatment for MRSA infections. RESULT CALLED TO, READ BACK BY AND VERIFIED WITH: A. WELCH RN AT 0340 ON 03.03.17 BY SHUEA      Studies: No results found.  Scheduled Meds: . Chlorhexidine Gluconate Cloth  6 each Topical Q0600  .  fluconazole  100 mg Oral Daily  . Influenza vac split quadrivalent PF  0.5 mL Intramuscular Tomorrow-1000  . insulin aspart  0-20 Units Subcutaneous TID WC  . insulin aspart  0-5 Units Subcutaneous QHS  . insulin detemir  28 Units Subcutaneous BID  . [START ON 08/23/2015] levofloxacin  500 mg Oral Daily  . loperamide  4 mg Oral Once  . methimazole  5 mg Oral BID  . mupirocin ointment  1 application Nasal BID  . nicotine  21 mg Transdermal Daily  . pantoprazole  40 mg Oral Q1200  . propranolol ER  60 mg Oral Daily  . sodium chloride flush  3 mL Intravenous Q12H   Continuous Infusions:    Time spent: 25 minutes    Jessica Tapia  [facility] Pager [phone]. If 7PM-7AM, please contact night-coverage at [email or web address], password [email or web address] 08/22/2015, 2:33 PM  LOS: 5 days

## 2015-08-22 NOTE — Progress Notes (Signed)
Received report from 5 west RN, Pt arrived unit, alert and oriented, will continue with current plan of care.

## 2015-08-23 DIAGNOSIS — E0500 Thyrotoxicosis with diffuse goiter without thyrotoxic crisis or storm: Secondary | ICD-10-CM

## 2015-08-23 DIAGNOSIS — N3000 Acute cystitis without hematuria: Secondary | ICD-10-CM

## 2015-08-23 DIAGNOSIS — R946 Abnormal results of thyroid function studies: Secondary | ICD-10-CM

## 2015-08-23 LAB — GLUCOSE, CAPILLARY
GLUCOSE-CAPILLARY: 233 mg/dL — AB (ref 65–99)
Glucose-Capillary: 228 mg/dL — ABNORMAL HIGH (ref 65–99)
Glucose-Capillary: 413 mg/dL — ABNORMAL HIGH (ref 65–99)

## 2015-08-23 LAB — C DIFFICILE QUICK SCREEN W PCR REFLEX
C DIFFICILE (CDIFF) TOXIN: NEGATIVE
C DIFFICLE (CDIFF) ANTIGEN: NEGATIVE
C Diff interpretation: NEGATIVE

## 2015-08-23 MED ORDER — SACCHAROMYCES BOULARDII 250 MG PO CAPS
250.0000 mg | ORAL_CAPSULE | Freq: Two times a day (BID) | ORAL | Status: DC
  Administered 2015-08-23: 250 mg via ORAL
  Filled 2015-08-23 (×2): qty 1

## 2015-08-23 MED ORDER — PROMETHAZINE HCL 25 MG PO TABS: 12.5000 mg | ORAL_TABLET | Freq: Four times a day (QID) | ORAL | Status: DC | PRN

## 2015-08-23 MED ORDER — INSULIN DETEMIR 100 UNIT/ML ~~LOC~~ SOLN
35.0000 [IU] | Freq: Two times a day (BID) | SUBCUTANEOUS | Status: DC
  Filled 2015-08-23: qty 0.35

## 2015-08-23 MED ORDER — INSULIN ASPART 100 UNIT/ML ~~LOC~~ SOLN: 0.0000 [IU] | Freq: Three times a day (TID) | SUBCUTANEOUS | Status: DC

## 2015-08-23 MED ORDER — INSULIN ASPART 100 UNIT/ML ~~LOC~~ SOLN: 10.0000 [IU] | Freq: Three times a day (TID) | SUBCUTANEOUS | Status: DC

## 2015-08-23 NOTE — Progress Notes (Signed)
[facility] PROGRESS NOTE  Jessica Tapia VHQ:[id number] DOB: 1985-07-14 DOA: 08/17/2015 PCP: No primary care provider on file.  Brief Summary  Jessica Tapia is a pleasant 30 year old female with diabetes type I poorly controlled hemoglobin A1c 13.7/Graves' disease causing hyperthyroidism patient off methimazole/propranolol for the last 2 years/essential hypertension/status post left above elbow amputation, who came in with nausea, vomiting, diarrhea and abdominal pain and was found to have diabetic ketoacidosis with UTI/suggestion of pneumonia on chest x-ray and sepsis like picture. Patient also noted to have hyperthyroidism with low TSH and high free T4. Her MRSA PCR was positive. Her workup included CT chest/CT abdomen and pelvis with unremarkable findings. There was concern for valvular abscess which was not seen on CT. Patient was placed on DKA protocol with improvement. She was treated as if septic with broad-spectrum antibiotics which have been narrowed to Levaquin/fluconazole(?vaginal candidiasis), was started on propranolol/methimazole, and she is now on Levemir/SSI and sugars are showing improvement.  Having ongoing diarrhea, not improved with imodium.    Assessment/Plan  DKA (diabetic ketoacidoses) (HCC)(resolved)/diabetes mellitus type 1, still very hyperglycemic  Poorly controlled long-term, hemoglobin A1c 14.2.   Increase to Levemir to 35 units bid  Start standing aspart with meals  Decrease to moderate dose SSI  Diarrhea/Nausea and vomiting/Abdominal pain.  She also has tremors and appears somewhat anxious.  I suspect that she may be withdrawing from a medication or drug, although she denies alcohol, opiate, and benzodiazepine dependence, alternatively, these symptoms may be related to her hyperthyroidism.  Mild AST:ALT elevation suggestive of EtOH use and trended down gradually -  C diff PCR -  Start recording stools strictly (not witnessed by RN) -  Start lomotil if C.  Diff PCR negative  MRSA carrier/UTI (urinary tract infection)/PNA (pneumonia)/vaginal pain ?yeast  Continue Levaquin to complete 7 days of antibiotics, last dose tomorrow  Continue Fluconazole  Graves' disease/Low tsh/thyromegaly (TSH 0.012, free T4 1.94)  Propranolol  Methimazole  Needs follow up with Endocrinology outpatient  Essential Htn  BP better controlled  Continue Propranolol, Hydralazine as needed. Pain control.  Hypokalemia/hypomagnesemia, resolved with supplementation  Microcytic anemia -  Iron studies, B12, folate -  TSH was low (hyperthyroid) -  Occult stool -  Repeat hgb in AM  Diet:  diabetic Access:  PIV IVF:  off Proph:  lovenox  Code Status: full Family Communication: patient alone Disposition Plan: pending C. Diff testing and likely discharge tomorrow   Consultants:  None  Procedures:  None  Antibiotics:  Levofloxacin  HPI/Subjective:  Had 5 watery stools.  Had some nausea and one episode of vomiting.  Requests her zofran be converted to phenergan.    Objective: Filed Vitals:   08/22/15 1337 08/22/15 2119 08/23/15 0548 08/23/15 1603  BP: 126/84 116/69 103/79 107/83  Pulse: 93 98 116 94  Temp: 98.2 F (36.8 C) 98 F (36.7 C) 97.8 F (36.6 C) 98.2 F (36.8 C)  TempSrc: Oral Oral Oral Oral  Resp: Height:      Weight:      SpO2: 98% 100% 100% 100%    Intake/Output Summary (Last 24 hours) at 08/23/15 1715 Last data filed at 08/23/15 1600  Gross per 24 hour  Intake    960 ml  Output      0 ml  Net    960 ml   Filed Weights   08/18/15 0011 08/18/15 0500  Weight: 65.4 kg (144 lb 2.9 oz) 65.4 kg (144 lb 2.9 oz)  Body mass index is 21.28 kg/(m^2).  Exam:   General:  Adult female, somewhat tremulous, No acute distress  HEENT:  NCAT, MMM  Cardiovascular:  Tachycardic RR, nl S1, S2 no mrg, 2+ pulses, warm extremities  Respiratory:  CTAB, no increased WOB  Abdomen:   NABS, soft, ND, diffusely TTP  without rebound or guarding  MSK:   Normal tone and bulk, no LEE  Neuro:  Fine tremor throughout  Data Reviewed: Basic Metabolic Panel:  Recent Labs Lab 08/18/15 2200 08/19/15 0341 08/20/15 0445 08/21/15 0549 08/22/15 0508  NA 137 138 132* 135 136  K 4.0 3.4* 4.5 4.1 4.0  CL 108 110 99* 101 101  CO2 21* 20* GLUCOSE 200* 121* 386* 250* 117*  BUN CREATININE 0.78 0.43* 0.50 0.46 0.39*  CALCIUM 8.4* 8.8* 8.9 8.8* 9.1  MG  --  1.4* 1.7  --   --   PHOS  --  3.8  --   --   --    Liver Function Tests:  Recent Labs Lab 08/17/15 2128 08/18/15 0328 08/19/15 0341 08/21/15 0549  AST 115* 175* 104* 31  ALT 37 62* 73* 46  ALKPHOS 203* 236* 207* 186*  BILITOT 1.3* 0.7 0.1* 0.2*  PROT 7.1 6.1* 6.2* 6.5  ALBUMIN 3.3* 2.8* 2.8* 2.9*    Recent Labs Lab 08/18/15 2200  LIPASE 23  AMYLASE 58   No results for input(s): AMMONIA in the last 168 hours. CBC:  Recent Labs Lab 08/17/15 2128 08/18/15 0328 08/19/15 0341 08/20/15 0445 08/21/15 0549 08/22/15 0508  WBC 7.4 8.8 4.2 3.8* 3.7* 4.4  NEUTROABS 4.4  --  1.5*  --   --   --   HGB 11.5* 10.5* 9.8* 10.4* 10.8* 11.7*  HCT 38.0 32.8* 30.9* 33.4* 34.7* 37.4  MCV 75.0* 72.9* 73.2* 73.6* 73.5* 73.3*  PLT 187 176 152 145* 160 167    Recent Results (from the past 240 hour(s))  Urine culture     Status: None   Collection Time: 08/17/15 10:05 PM  Result Value Ref Range Status   Specimen Description URINE, CLEAN CATCH  Final   Special Requests Immunocompromised  Final   Culture   Final    MULTIPLE SPECIES PRESENT, SUGGEST RECOLLECTION Performed at [facility]    Report Status 08/19/2015 FINAL  Final  MRSA PCR Screening     Status: Abnormal   Collection Time: 08/18/15 12:05 AM  Result Value Ref Range Status   MRSA by PCR POSITIVE (A) NEGATIVE Final    Comment:        The GeneXpert MRSA Assay (FDA approved for NASAL specimens only), is one component of a comprehensive MRSA  colonization surveillance program. It is not intended to diagnose MRSA infection nor to guide or monitor treatment for MRSA infections. RESULT CALLED TO, READ BACK BY AND VERIFIED WITH: A. WELCH RN AT 0340 ON 03.03.17 BY SHUEA      Studies: No results found.  Scheduled Meds: . fluconazole  100 mg Oral Daily  . Influenza vac split quadrivalent PF  0.5 mL Intramuscular Tomorrow-1000  . [START ON 08/24/2015] insulin aspart  0-15 Units Subcutaneous TID WC  . insulin aspart  0-5 Units Subcutaneous QHS  . [START ON 08/24/2015] insulin aspart  10 Units Subcutaneous TID WC  . insulin detemir  35 Units Subcutaneous BID  . levofloxacin  500 mg Oral Daily  . methimazole  5 mg Oral BID  . nicotine  21 mg Transdermal Daily  . pantoprazole  40 mg Oral Q1200  . propranolol ER  60 mg Oral Daily  . saccharomyces boulardii  250 mg Oral BID  . sodium chloride flush  3 mL Intravenous Q12H   Continuous Infusions:   Principal Problem:   DM type 1, not at goal [facility](HCC) Active Problems:   MRSA carrier   UTI (urinary tract infection)   Benign essential HTN   Thyromegaly   Low TSH level   Graves' disease   Vaginal pain    Time spent: 30 min    Jessica Tapia, [facility]  [facility] Pager [phone]. If 7PM-7AM, please contact night-coverage at [email or web address], password [email or web address] 08/23/2015, 5:15 PM  LOS: 6 days

## 2015-08-23 NOTE — Care Management Note (Signed)
Case Management Note  Patient Details  Name: Jessica Tapia MRN: [id number] Date of Birth: 05/17/1986  Subjective/Objective:                  DKA Action/Plan: Discharge planning Expected Discharge Date:  08/23/15               Expected Discharge Plan:  Home/Self Care  In-House Referral:  NA  Discharge planning Services  [facility]  Post Acute Care Choice:  NA Choice offered to:  NA  DME Arranged:    DME Agency:     HH Arranged:    HH Agency:     Status of Service:  Completed, signed off  Medicare Important Message Given:    Date Medicare IM Given:    Medicare IM give by:    Date Additional Medicare IM Given:    Additional Medicare Important Message give by:     If discussed at Long Length of Stay Meetings, dates discussed:    Additional Comments: CM gave pt MATCH letter with list of participating pharmacies and pt verbalized understanding of all MATCH parameters.  After multiple attempts to make an appt for pt (6X+) without any response, CM also [facility] and verbalized understanding she is to GO to the clinic any weekday morning from 9-10am and  ask for follow up care and to make an appt with a Navigator for insurance and to secure a PCP.  No other CM needs were communicated. Yves DillJeffries, Eyad Rochford Christine, RN 08/23/2015, 2:43 PM

## 2015-08-23 NOTE — Progress Notes (Signed)
Patietn asked to leave [facility] and got her own cab. She has left AMA before and is aware insurance will not be covering her stay.  MD notified, and previous day shift MD aware she wanted to leave yesterday.  IV was already out and she is alert and oriented.

## 2015-08-25 ENCOUNTER — Inpatient Hospital Stay (HOSPITAL_COMMUNITY)
Admission: EM | Admit: 2015-08-25 | Discharge: 2015-08-26 | DRG: 638 | Disposition: A | Discharge status: Home / Self Care | Payer: Self-pay | Attending: Internal Medicine | Admitting: Internal Medicine

## 2015-08-25 ENCOUNTER — Encounter (HOSPITAL_COMMUNITY): Payer: Self-pay

## 2015-08-25 ENCOUNTER — Inpatient Hospital Stay (HOSPITAL_COMMUNITY): Payer: Self-pay

## 2015-08-25 ENCOUNTER — Inpatient Hospital Stay (HOSPITAL_COMMUNITY): Payer: MEDICAID

## 2015-08-25 DIAGNOSIS — Z882 Allergy status to sulfonamides status: Secondary | ICD-10-CM

## 2015-08-25 DIAGNOSIS — D509 Iron deficiency anemia, unspecified: Secondary | ICD-10-CM | POA: Diagnosis present

## 2015-08-25 DIAGNOSIS — E1143 Type 2 diabetes mellitus with diabetic autonomic (poly)neuropathy: Secondary | ICD-10-CM | POA: Diagnosis present

## 2015-08-25 DIAGNOSIS — E1310 Other specified diabetes mellitus with ketoacidosis without coma: Secondary | ICD-10-CM

## 2015-08-25 DIAGNOSIS — E871 Hypo-osmolality and hyponatremia: Secondary | ICD-10-CM | POA: Diagnosis present

## 2015-08-25 DIAGNOSIS — K5904 Chronic idiopathic constipation: Secondary | ICD-10-CM | POA: Diagnosis present

## 2015-08-25 DIAGNOSIS — K3184 Gastroparesis: Secondary | ICD-10-CM

## 2015-08-25 DIAGNOSIS — I10 Essential (primary) hypertension: Secondary | ICD-10-CM | POA: Diagnosis present

## 2015-08-25 DIAGNOSIS — E0500 Thyrotoxicosis with diffuse goiter without thyrotoxic crisis or storm: Secondary | ICD-10-CM | POA: Diagnosis present

## 2015-08-25 DIAGNOSIS — Z765 Malingerer [conscious simulation]: Secondary | ICD-10-CM | POA: Diagnosis present

## 2015-08-25 DIAGNOSIS — N179 Acute kidney failure, unspecified: Secondary | ICD-10-CM | POA: Diagnosis present

## 2015-08-25 DIAGNOSIS — Z9119 Patient's noncompliance with other medical treatment and regimen: Secondary | ICD-10-CM

## 2015-08-25 DIAGNOSIS — D72829 Elevated white blood cell count, unspecified: Secondary | ICD-10-CM | POA: Diagnosis present

## 2015-08-25 DIAGNOSIS — Z9114 Patient's other noncompliance with medication regimen: Secondary | ICD-10-CM

## 2015-08-25 DIAGNOSIS — F419 Anxiety disorder, unspecified: Secondary | ICD-10-CM | POA: Diagnosis present

## 2015-08-25 DIAGNOSIS — Z9111 Patient's noncompliance with dietary regimen: Secondary | ICD-10-CM

## 2015-08-25 DIAGNOSIS — F1410 Cocaine abuse, uncomplicated: Secondary | ICD-10-CM | POA: Diagnosis present

## 2015-08-25 DIAGNOSIS — E1043 Type 1 diabetes mellitus with diabetic autonomic (poly)neuropathy: Secondary | ICD-10-CM | POA: Diagnosis present

## 2015-08-25 DIAGNOSIS — Z79899 Other long term (current) drug therapy: Secondary | ICD-10-CM

## 2015-08-25 DIAGNOSIS — E860 Dehydration: Secondary | ICD-10-CM | POA: Diagnosis present

## 2015-08-25 DIAGNOSIS — F319 Bipolar disorder, unspecified: Secondary | ICD-10-CM | POA: Diagnosis present

## 2015-08-25 DIAGNOSIS — D638 Anemia in other chronic diseases classified elsewhere: Secondary | ICD-10-CM | POA: Diagnosis present

## 2015-08-25 DIAGNOSIS — Z452 Encounter for adjustment and management of vascular access device: Secondary | ICD-10-CM | POA: Diagnosis present

## 2015-08-25 DIAGNOSIS — Z89212 Acquired absence of left upper limb below elbow: Secondary | ICD-10-CM

## 2015-08-25 DIAGNOSIS — E1110 Type 2 diabetes mellitus with ketoacidosis without coma: Secondary | ICD-10-CM | POA: Diagnosis present

## 2015-08-25 DIAGNOSIS — Z794 Long term (current) use of insulin: Secondary | ICD-10-CM

## 2015-08-25 DIAGNOSIS — E0590 Thyrotoxicosis, unspecified without thyrotoxic crisis or storm: Secondary | ICD-10-CM | POA: Insufficient documentation

## 2015-08-25 DIAGNOSIS — E1010 Type 1 diabetes mellitus with ketoacidosis without coma: Principal | ICD-10-CM | POA: Diagnosis present

## 2015-08-25 DIAGNOSIS — R000 Tachycardia, unspecified: Secondary | ICD-10-CM | POA: Diagnosis present

## 2015-08-25 LAB — BASIC METABOLIC PANEL
ANION GAP: 9 (ref 5–15)
Anion gap: 27 — ABNORMAL HIGH (ref 5–15)
Anion gap: 10 (ref 5–15)
Anion gap: 8 (ref 5–15)
BUN: 28 mg/dL — AB (ref 6–20)
BUN: 14 mg/dL (ref 6–20)
BUN: 11 mg/dL (ref 6–20)
BUN: 9 mg/dL (ref 6–20)
CHLORIDE: 108 mmol/L (ref 101–111)
CHLORIDE: 109 mmol/L (ref 101–111)
CHLORIDE: 112 mmol/L — AB (ref 101–111)
CO2: 7 mmol/L — ABNORMAL LOW (ref 22–32)
CO2: 17 mmol/L — ABNORMAL LOW (ref 22–32)
CO2: 16 mmol/L — ABNORMAL LOW (ref 22–32)
CO2: 17 mmol/L — ABNORMAL LOW (ref 22–32)
CREATININE: 0.59 mg/dL (ref 0.44–1.00)
Calcium: 9.2 mg/dL (ref 8.9–10.3)
Calcium: 8.5 mg/dL — ABNORMAL LOW (ref 8.9–10.3)
Calcium: 8 mg/dL — ABNORMAL LOW (ref 8.9–10.3)
Calcium: 8.4 mg/dL — ABNORMAL LOW (ref 8.9–10.3)
Chloride: 97 mmol/L — ABNORMAL LOW (ref 101–111)
Creatinine, Ser: 1.45 mg/dL — ABNORMAL HIGH (ref 0.44–1.00)
Creatinine, Ser: 0.52 mg/dL (ref 0.44–1.00)
Creatinine, Ser: 0.56 mg/dL (ref 0.44–1.00)
GFR calc non Af Amer: >60 mL/min (ref >60)
GFR calc non Af Amer: >60 mL/min (ref >60)
GFR calc non Af Amer: >60 mL/min (ref >60)
GFR, EST AFRICAN AMERICAN: 56 mL/min — AB (ref >60)
GFR, EST NON AFRICAN AMERICAN: 48 mL/min — AB (ref >60)
Glucose, Bld: 812 mg/dL (ref 65–99)
Glucose, Bld: 155 mg/dL — ABNORMAL HIGH (ref 65–99)
Glucose, Bld: 168 mg/dL — ABNORMAL HIGH (ref 65–99)
Glucose, Bld: 88 mg/dL (ref 65–99)
POTASSIUM: 5.6 mmol/L — AB (ref 3.5–5.1)
POTASSIUM: 3.9 mmol/L (ref 3.5–5.1)
POTASSIUM: 3.5 mmol/L (ref 3.5–5.1)
POTASSIUM: 3.4 mmol/L — AB (ref 3.5–5.1)
SODIUM: 135 mmol/L (ref 135–145)
SODIUM: 133 mmol/L — AB (ref 135–145)
SODIUM: 138 mmol/L (ref 135–145)
Sodium: 131 mmol/L — ABNORMAL LOW (ref 135–145)

## 2015-08-25 LAB — I-STAT ARTERIAL BLOOD GAS, ED
ACID-BASE DEFICIT: 25 mmol/L — AB (ref 0.0–2.0)
Bicarbonate: 3.8 mEq/L — ABNORMAL LOW (ref 20.0–24.0)
O2 SAT: 96 %
PH ART: 7.032 — AB (ref 7.350–7.450)
pCO2 arterial: 14.4 mmHg — CL (ref 35.0–45.0)
pO2, Arterial: 119 mmHg — ABNORMAL HIGH (ref 80.0–100.0)

## 2015-08-25 LAB — GLUCOSE, CAPILLARY
GLUCOSE-CAPILLARY: 181 mg/dL — AB (ref 65–99)
GLUCOSE-CAPILLARY: 130 mg/dL — AB (ref 65–99)
Glucose-Capillary: 148 mg/dL — ABNORMAL HIGH (ref 65–99)
Glucose-Capillary: 165 mg/dL — ABNORMAL HIGH (ref 65–99)
Glucose-Capillary: 169 mg/dL — ABNORMAL HIGH (ref 65–99)
Glucose-Capillary: 147 mg/dL — ABNORMAL HIGH (ref 65–99)
Glucose-Capillary: 185 mg/dL — ABNORMAL HIGH (ref 65–99)

## 2015-08-25 LAB — URINALYSIS, ROUTINE W REFLEX MICROSCOPIC
Bilirubin Urine: NEGATIVE
Bilirubin Urine: NEGATIVE
Ketones, ur: >80 mg/dL — AB
Ketones, ur: >80 mg/dL — AB
LEUKOCYTES UA: NEGATIVE
Nitrite: NEGATIVE
Nitrite: NEGATIVE
PROTEIN: NEGATIVE mg/dL
PROTEIN: NEGATIVE mg/dL
SPECIFIC GRAVITY, URINE: 1.025 (ref 1.005–1.030)
Specific Gravity, Urine: 1.021 (ref 1.005–1.030)
pH: 5 (ref 5.0–8.0)
pH: 5 (ref 5.0–8.0)

## 2015-08-25 LAB — CBC
HEMATOCRIT: 37.2 % (ref 36.0–46.0)
Hemoglobin: 11.6 g/dL — ABNORMAL LOW (ref 12.0–15.0)
MCH: 23.4 pg — ABNORMAL LOW (ref 26.0–34.0)
MCHC: 31.2 g/dL (ref 30.0–36.0)
MCV: 75 fL — AB (ref 78.0–100.0)
Platelets: 262 10*3/uL (ref 150–400)
RBC: 4.96 MIL/uL (ref 3.87–5.11)
RDW: 17.3 % — AB (ref 11.5–15.5)
WBC: 11.9 10*3/uL — ABNORMAL HIGH (ref 4.0–10.5)

## 2015-08-25 LAB — CBG MONITORING, ED
GLUCOSE-CAPILLARY: 305 mg/dL — AB (ref 65–99)
GLUCOSE-CAPILLARY: 124 mg/dL — AB (ref 65–99)
Glucose-Capillary: 565 mg/dL (ref 65–99)
Glucose-Capillary: 112 mg/dL — ABNORMAL HIGH (ref 65–99)

## 2015-08-25 LAB — RETICULOCYTES
RBC.: 4.29 MIL/uL (ref 3.87–5.11)
RETIC CT PCT: 1.1 % (ref 0.4–3.1)
Retic Count, Absolute: 47.2 10*3/uL (ref 19.0–186.0)

## 2015-08-25 LAB — TROPONIN I: Troponin I: <0.03 ng/mL (ref <0.031)

## 2015-08-25 LAB — URINE MICROSCOPIC-ADD ON

## 2015-08-25 LAB — BLOOD GAS, VENOUS
Acid-base deficit: 12.1 mmol/L — ABNORMAL HIGH (ref 0.0–2.0)
Bicarbonate: 13.7 mEq/L — ABNORMAL LOW (ref 20.0–24.0)
O2 SAT: 85.8 %
PCO2 VEN: 32.1 mmHg — AB (ref 45.0–50.0)
PH VEN: 7.253 (ref 7.250–7.300)
PO2 VEN: 56.8 mmHg — AB (ref 31.0–45.0)
Patient temperature: 98.6
TCO2: 14.7 mmol/L (ref 0–100)

## 2015-08-25 LAB — RAPID URINE DRUG SCREEN, HOSP PERFORMED
Amphetamines: NOT DETECTED
BARBITURATES: NOT DETECTED
Benzodiazepines: NOT DETECTED
COCAINE: POSITIVE — AB
OPIATES: POSITIVE — AB
Tetrahydrocannabinol: NOT DETECTED

## 2015-08-25 LAB — FERRITIN: FERRITIN: 10 ng/mL — AB (ref 11–307)

## 2015-08-25 LAB — IRON AND TIBC
Iron: 46 ug/dL (ref 28–170)
SATURATION RATIOS: 10 % — AB (ref 10.4–31.8)
TIBC: 482 ug/dL — AB (ref 250–450)
UIBC: 436 ug/dL

## 2015-08-25 LAB — PREGNANCY, URINE: Preg Test, Ur: NEGATIVE

## 2015-08-25 LAB — LACTIC ACID, PLASMA: Lactic Acid, Venous: 0.6 mmol/L (ref 0.5–2.0)

## 2015-08-25 LAB — FOLATE: Folate: 37.6 ng/mL (ref >5.9)

## 2015-08-25 LAB — MAGNESIUM: MAGNESIUM: 1.7 mg/dL (ref 1.7–2.4)

## 2015-08-25 LAB — CK: CK TOTAL: 63 U/L (ref 38–234)

## 2015-08-25 LAB — PHOSPHORUS: PHOSPHORUS: 3.4 mg/dL (ref 2.5–4.6)

## 2015-08-25 LAB — BETA-HYDROXYBUTYRIC ACID: Beta-Hydroxybutyric Acid: 1.93 mmol/L — ABNORMAL HIGH (ref 0.05–0.27)

## 2015-08-25 LAB — VITAMIN B12: VITAMIN B 12: 499 pg/mL (ref 180–914)

## 2015-08-25 MED ORDER — SODIUM CHLORIDE 0.9 % IV BOLUS (SEPSIS)
1000.0000 mL | Freq: Once | INTRAVENOUS | Status: AC
  Administered 2015-08-25: 1000 mL via INTRAVENOUS

## 2015-08-25 MED ORDER — HYDROMORPHONE HCL 1 MG/ML IJ SOLN
1.0000 mg | INTRAMUSCULAR | Status: DC | PRN
Start: 2015-08-25 — End: 2015-08-25

## 2015-08-25 MED ORDER — LORAZEPAM 2 MG/ML IJ SOLN
1.0000 mg | INTRAMUSCULAR | Status: DC | PRN
  Administered 2015-08-25 (×2): 1 mg via INTRAVENOUS
  Filled 2015-08-25 (×2): qty 1

## 2015-08-25 MED ORDER — HYDROMORPHONE HCL 1 MG/ML IJ SOLN
0.5000 mg | INTRAMUSCULAR | Status: DC | PRN
  Administered 2015-08-25 – 2015-08-26 (×5): 0.5 mg via INTRAVENOUS
  Filled 2015-08-25 (×4): qty 1

## 2015-08-25 MED ORDER — MORPHINE SULFATE (PF) 4 MG/ML IV SOLN
4.0000 mg | Freq: Once | INTRAVENOUS | Status: AC
  Administered 2015-08-25: 4 mg via INTRAVENOUS
  Filled 2015-08-25: qty 1

## 2015-08-25 MED ORDER — SODIUM CHLORIDE 0.9 % IV SOLN
INTRAVENOUS | Status: AC
  Administered 2015-08-25: 15.2 [IU]/h via INTRAVENOUS
  Filled 2015-08-25: qty 2.5

## 2015-08-25 MED ORDER — LORAZEPAM 1 MG PO TABS
1.0000 mg | ORAL_TABLET | ORAL | Status: DC | PRN
  Administered 2015-08-25 – 2015-08-26 (×2): 1 mg via ORAL
  Filled 2015-08-25 (×3): qty 1

## 2015-08-25 MED ORDER — HYDROMORPHONE HCL 1 MG/ML IJ SOLN
0.5000 mg | INTRAMUSCULAR | Status: DC | PRN
  Administered 2015-08-25 (×3): 0.5 mg via INTRAVENOUS
  Filled 2015-08-25 (×3): qty 1

## 2015-08-25 MED ORDER — HYDROMORPHONE HCL 1 MG/ML IJ SOLN
INTRAMUSCULAR | Status: AC
  Administered 2015-08-25: 0.5 mg via INTRAVENOUS
  Filled 2015-08-25: qty 1

## 2015-08-25 MED ORDER — DEXTROSE-NACL 5-0.45 % IV SOLN
INTRAVENOUS | Status: DC
  Administered 2015-08-25: 11:00:00 via INTRAVENOUS

## 2015-08-25 MED ORDER — OXYCODONE HCL 5 MG PO TABS: 10.0000 mg | ORAL_TABLET | ORAL | Status: DC | PRN

## 2015-08-25 MED ORDER — DIPHENHYDRAMINE HCL 50 MG/ML IJ SOLN
50.0000 mg | INTRAMUSCULAR | Status: DC | PRN
  Administered 2015-08-25 – 2015-08-26 (×5): 50 mg via INTRAVENOUS
  Filled 2015-08-25 (×5): qty 1

## 2015-08-25 MED ORDER — SODIUM CHLORIDE 0.9 % IV BOLUS (SEPSIS): 2000.0000 mL | Freq: Once | INTRAVENOUS | Status: DC

## 2015-08-25 MED ORDER — POLYETHYLENE GLYCOL 3350 17 G PO PACK
17.0000 g | PACK | Freq: Every day | ORAL | Status: DC
  Filled 2015-08-25: qty 1

## 2015-08-25 MED ORDER — SODIUM CHLORIDE 0.9 % IV SOLN
INTRAVENOUS | Status: DC
  Administered 2015-08-25: 18:00:00 via INTRAVENOUS
  Administered 2015-08-26: 150 mL/h via INTRAVENOUS

## 2015-08-25 MED ORDER — MORPHINE SULFATE (PF) 2 MG/ML IV SOLN
2.0000 mg | INTRAVENOUS | Status: DC | PRN
Start: 2015-08-25 — End: 2015-08-25
  Administered 2015-08-25: 2 mg via INTRAVENOUS
  Filled 2015-08-25: qty 1

## 2015-08-25 MED ORDER — SODIUM CHLORIDE 0.9 % IV BOLUS (SEPSIS): 1000.0000 mL | Freq: Once | INTRAVENOUS | Status: DC

## 2015-08-25 MED ORDER — ENOXAPARIN SODIUM 40 MG/0.4ML ~~LOC~~ SOLN
40.0000 mg | SUBCUTANEOUS | Status: DC
  Filled 2015-08-25 (×3): qty 0.4

## 2015-08-25 MED ORDER — PROMETHAZINE HCL 25 MG/ML IJ SOLN
25.0000 mg | Freq: Four times a day (QID) | INTRAMUSCULAR | Status: DC | PRN
  Administered 2015-08-25 – 2015-08-26 (×4): 25 mg via INTRAVENOUS
  Filled 2015-08-25 (×5): qty 1

## 2015-08-25 MED ORDER — ONDANSETRON HCL 4 MG/2ML IJ SOLN
4.0000 mg | Freq: Once | INTRAMUSCULAR | Status: AC
  Administered 2015-08-25: 4 mg via INTRAVENOUS
  Filled 2015-08-25: qty 2

## 2015-08-25 MED ORDER — METOPROLOL TARTRATE 1 MG/ML IV SOLN
2.5000 mg | Freq: Four times a day (QID) | INTRAVENOUS | Status: DC
  Administered 2015-08-25 – 2015-08-26 (×4): 2.5 mg via INTRAVENOUS
  Filled 2015-08-25 (×4): qty 5

## 2015-08-25 MED ORDER — INSULIN ASPART 100 UNIT/ML ~~LOC~~ SOLN
0.0000 [IU] | SUBCUTANEOUS | Status: DC
  Administered 2015-08-25: 3 [IU] via SUBCUTANEOUS
  Administered 2015-08-26: 11 [IU] via SUBCUTANEOUS
  Administered 2015-08-26: 20 [IU] via SUBCUTANEOUS

## 2015-08-25 MED ORDER — HYDROMORPHONE HCL 1 MG/ML IJ SOLN
2.0000 mg | Freq: Once | INTRAMUSCULAR | Status: AC
  Administered 2015-08-25: 2 mg via INTRAVENOUS
  Filled 2015-08-25: qty 2

## 2015-08-25 MED ORDER — DEXTROSE-NACL 5-0.45 % IV SOLN: INTRAVENOUS | Status: DC

## 2015-08-25 MED ORDER — DOCUSATE SODIUM 100 MG PO CAPS
100.0000 mg | ORAL_CAPSULE | Freq: Two times a day (BID) | ORAL | Status: DC
  Administered 2015-08-25: 100 mg via ORAL
  Filled 2015-08-25 (×3): qty 1

## 2015-08-25 MED ORDER — INSULIN REGULAR HUMAN 100 UNIT/ML IJ SOLN
INTRAMUSCULAR | Status: DC
  Administered 2015-08-25: 5.4 [IU]/h via INTRAVENOUS
  Filled 2015-08-25: qty 2.5

## 2015-08-25 MED ORDER — INSULIN ASPART PROT & ASPART (70-30 MIX) 100 UNIT/ML ~~LOC~~ SUSP
40.0000 [IU] | Freq: Two times a day (BID) | SUBCUTANEOUS | Status: DC
  Administered 2015-08-25 – 2015-08-26 (×2): 40 [IU] via SUBCUTANEOUS
  Filled 2015-08-25: qty 10

## 2015-08-25 MED ORDER — ONDANSETRON HCL 4 MG/2ML IJ SOLN
4.0000 mg | Freq: Four times a day (QID) | INTRAMUSCULAR | Status: DC
  Administered 2015-08-25: 4 mg via INTRAVENOUS
  Filled 2015-08-25 (×2): qty 2

## 2015-08-25 MED ORDER — METHIMAZOLE 10 MG PO TABS
10.0000 mg | ORAL_TABLET | Freq: Three times a day (TID) | ORAL | Status: DC
  Administered 2015-08-25 (×2): 10 mg via ORAL
  Filled 2015-08-25 (×6): qty 1

## 2015-08-25 MED ORDER — SODIUM CHLORIDE 0.9 % IV BOLUS (SEPSIS)
500.0000 mL | Freq: Once | INTRAVENOUS | Status: AC
  Administered 2015-08-25: 500 mL via INTRAVENOUS

## 2015-08-25 NOTE — ED Notes (Signed)
Pt refused to get ABG until she see the Md; Md notified; Md at bedside currently

## 2015-08-25 NOTE — Progress Notes (Signed)
Pt refused MRSA swab.

## 2015-08-25 NOTE — Plan of Care (Signed)
30 year old female with known history of diabetes mellitus type 1 who recently signed out AMA presents with weakness and generalized fatigue. Patient is in DKA again. Patient was started on IV insulin and aggressive hydration. ABGs pending.  Jessica Tapia.

## 2015-08-25 NOTE — H&P (Signed)
[facility] History and Physical                                                                                    Jessica Tapia, is a 30 y.o. female  MRN: [id number]   DOB - [birth year]  Admit Date - 08/25/2015  Outpatient Primary MD UNASSIGNED  Referring MD: Judd Lien / ER  PMH: Past Medical History  Diagnosis Date  . Diabetes mellitus without complication (HCC)       PSH: Past Surgical History  Procedure Laterality Date  . Amputation arm       CC:  Chief Complaint  Patient presents with  . Diabetic Ketoacidosis     HPI: 30 year old female who was recently admitted on 3/2 with DKA. She has a past medical history of Graves' disease and apparently has been very noncompliant with medications or both of these disease processes. According to the previous H&P she had been diagnosed with diabetes 4 years prior. She was admitted and started on insulin infusion to correct her DKA but became frustrated with what she perceived as a lack of progress and left AMA on 3/8. It was noted on progress note for that day that she remained very hyperglycemic and was requiring Levemir 35 units twice a day with sliding scale insulin. Her hemoglobin A1c was 14.2. Was also documented she had persistent diarrhea and nausea vomiting and abdominal pain and was very anxious and there were concerns she may be withdrawing from some type of drug or medication although patient denied to the attending physician at that time. Her TSH was also low at 0.012 with an elevated free T4 and free thyroxine and she was resumed on Tapazole and propranolol for symptom management.  Early this morning EMS was called to the patient's residence where she was found to be profoundly dehydrated with a disheveled appearance and was slightly combative upon their arrival although she was alert and oriented. She was able to ambulate to the bathroom prior to transport. Patient drank 450 mL of sterile water in the EMS truck and vomited  back up. Patient currently is denying use of alcohol or other drugs although she states she continues to smoke. She states that she typically does not take her medications as scheduled because she cannot afford them. She reports she has never applied for any type of Medicaid or disability. In review of CareEverywhere the patient was admitted in December 2016 at [facility] with DKA apparently telling staff there she had recently moved from [place]. During that hospitalization patient continued to complain of abdominal pain and stated she would not get a PICC line until she got IV opiates and then declined IV Toradol stating she would only take Dilaudid. She left that admission AMA as well. It does appear that in 2013 she was followed by [facility] with [facility] and at that time she was on 10 mg Tapazole 3 times a day with Inderal 80 mg twice a day as well as 70/30 insulin 40 units twice a day. It's documented that she is very noncompliant with medications. She apparently also carries a diagnosis of bipolar disorder and has previously  been on Abilify.  At the present time the patient is primarily complaining of excessive thirst and significant abdominal pain.  ER Evaluation and treatment: Staff of been unable to obtain a temperature, initial BP was 144/84, pulse 144, respirations 38, room air saturations are 100% EKG: Sinus tachycardia with ventricular rate 158 bpm, QTC 441 ms, no ischemic changes but peaked T waves consistent with mildly elevated potassium Laboratory data: Na 131, K 5.6, BUN 28, Cr 1.28, serum glucose 812, AG 26, troponin less than 0.03, WBC 11,900, hemoglobin 11.6, MCV 75, platelets 262,000, urinalysis abnormal with cloudy appearance greater than 1000 sugar greater than 80 ketones small leukocytes yeast and 6-30 WBCs; ABG highly abnormal with pH 7.032, PCO2 14.4, acid base 25 Normal saline bolus total 2 L IV insulin infusion Zofran 4 mg 1  dose Morphine 4 mg 2 doses   Review of Systems   In addition to the HPI above,  No Fever-chills, myalgias or other constitutional symptoms No Headache, changes with Vision or hearing, new weakness, tingling, numbness in any extremity, No problems swallowing food or Liquids, indigestion/reflux No Chest pain, Cough or Shortness of Breath, palpitations, orthopnea or DOE No dysuria, hematuria or flank pain No new skin rashes, lesions, masses or bruises, No new joints pains-aches No recent weight gain or loss  *A full 10 point Review of Systems was done, except as stated above, all other Review of Systems were negative.  Social History Social History  Substance Use Topics  . Smoking status: Never Smoker   . Smokeless tobacco: Not on file  . Alcohol Use: No    Resides at: Private residence  Lives with: Alone  Ambulatory status: Without assistive devices   Family History No family history on file. Patient reports difficulty recalling family history because "she feels so bad"   Prior to Admission medications   Medication Sig Start Date End Date Taking? Authorizing Provider  insulin aspart (NOVOLOG) 100 UNIT/ML injection Inject 1-10 Units into the skin 3 (three) times daily before meals. Per sliding scale    Historical Provider, MD  insulin detemir (LEVEMIR) 100 UNIT/ML injection Inject 12 Units into the skin 2 (two) times daily.    Historical Provider, MD  promethazine (PHENERGAN) 25 MG tablet Take 1 tablet (25 mg total) by mouth every 6 (six) hours as needed for nausea or vomiting. Patient not taking: Reported on 08/17/2015 05/31/15   Azalia Bilis, MD    Allergies  Allergen Reactions  . Sulfa Antibiotics     Reaction: unknown     Physical Exam  Vitals  Blood pressure 144/89, pulse 145, resp. rate 24, height 5\' 9"  (1.753 m), weight 140 lb (63.504 kg), SpO2 100 %.   General: Appears much older than stated age, quite restless  Psych: Anxious affect, Denies Suicidal or  Homicidal ideations, Awake Alert, Oriented X 3. Speech and thought patterns are clear and appropriate, no apparent short term memory deficits  Neuro:   No focal neurological deficits, CN II through XII intact, Strength 5/5 all 4 extremities, Sensation intact all 4 extremities.  ENT:  Ears appear Normal, mild exopthalmus, Conjunctivae clear, PER. Dry oral mucosa without erythema or exudates.  Neck:  Supple, No lymphadenopathy appreciated, slightly prominent thyroid on right  Respiratory:  Symmetrical chest wall movement, Good air movement bilaterally, CTAB. Room Air  Cardiac:  RRR and tachycardic, No Murmurs, no LE edema noted, no JVD, No carotid bruits, peripheral pulses palpable at 2+  Abdomen:  Positive bowel sounds, Soft, Non tender, Non  distended,  No masses appreciated, no obvious hepatosplenomegaly  Skin:  No Cyanosis, poor Skin Turgor, No Skin Rash or Bruise.  Extremities: Symmetrical without obvious trauma or injury,  no effusions. 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  Data Review  CBC  Recent Labs Lab 08/19/15 0341 08/20/15 0445 08/21/15 0549 08/22/15 0508 08/25/15 0530  WBC 4.2 3.8* 3.7* 4.4 11.9*  HGB 9.8* 10.4* 10.8* 11.7* 11.6*  HCT 30.9* 33.4* 34.7* 37.4 37.2  PLT 152 145* 160 167 262  MCV 73.2* 73.6* 73.5* 73.3* 75.0*  MCH 23.2* 22.9* 22.9* 22.9* 23.4*  MCHC 31.7 31.1 31.1 31.3 31.2  RDW 17.1* 16.8* 16.9* 17.0* 17.3*  LYMPHSABS 2.2  --   --   --   --   MONOABS 0.4  --   --   --   --   EOSABS 0.2  --   --   --   --   BASOSABS 0.0  --   --   --   --     Chemistries   Recent Labs Lab 08/19/15 0341 08/20/15 0445 08/21/15 0549 08/22/15 0508 08/25/15 0530  NA 138 132* 135 136 131*  K 3.4* 4.5 4.1 4.0 5.6*  CL 110 99* 101 101 97*  CO2 20* 25 27 26  7*  GLUCOSE 121* 386* 250* 117* 812*  BUN 14 14 15 17  28*  CREATININE 0.43* 0.50 0.46 0.39* 1.45*  CALCIUM 8.8* 8.9 8.8* 9.1 9.2  MG 1.4* 1.7  --   --   --   AST 104*  --  31  --   --   ALT  73*  --  46  --   --   ALKPHOS 207*  --  186*  --   --   BILITOT 0.1*  --  0.2*  --   --     estimated creatinine clearance is 57.4 mL/min (by C-G formula based on Cr of 1.45).  No results for input(s): TSH, T4TOTAL, T3FREE, THYROIDAB in the last 72 hours.  Invalid input(s): FREET3  Coagulation profile No results for input(s): INR, PROTIME in the last 168 hours.  No results for input(s): DDIMER in the last 72 hours.  Cardiac Enzymes  Recent Labs Lab 08/25/15 0530  TROPONINI <0.03    Invalid input(s): POCBNP  Urinalysis    Component Value Date/Time   COLORURINE YELLOW 08/25/2015 0540   COLORURINE Amber 10/30/2011 1656   APPEARANCEUR CLOUDY* 08/25/2015 0540   APPEARANCEUR Clear 10/30/2011 1656   LABSPEC 1.025 08/25/2015 0540   LABSPEC 1.027 10/30/2011 1656   PHURINE 5.0 08/25/2015 0540   PHURINE 5.0 10/30/2011 1656   GLUCOSEU >1000* 08/25/2015 0540   GLUCOSEU >=500 10/30/2011 1656   HGBUR MODERATE* 08/25/2015 0540   HGBUR Negative 10/30/2011 1656   BILIRUBINUR NEGATIVE 08/25/2015 0540   BILIRUBINUR 1+ 10/30/2011 1656   KETONESUR >80* 08/25/2015 0540   KETONESUR Trace 10/30/2011 1656   PROTEINUR NEGATIVE 08/25/2015 0540   PROTEINUR 30 mg/dL 16/03/9603 5409   UROBILINOGEN 0.2 11/30/2008 2139   NITRITE NEGATIVE 08/25/2015 0540   NITRITE Negative 10/30/2011 1656   LEUKOCYTESUR SMALL* 08/25/2015 0540   LEUKOCYTESUR Negative 10/30/2011 1656    Imaging results:   Ct Chest Wo Contrast  08/19/2015  CLINICAL DATA:  Left-sided chest pain. EXAM: CT CHEST WITHOUT CONTRAST TECHNIQUE: Multidetector CT imaging of the chest was performed following the standard protocol without IV contrast. COMPARISON:  None. FINDINGS: No pulmonary nodules, masses, or infiltrates. There is mucus within the trachea. There is  enlargement of the thyroid with no focal masses. Triangular soft tissue in the anterior mediastinum is consistent with residual thymus. The borders are concave with no  convexity. Evaluation of the hila is limited without contrast but no hilar adenopathy is identified. The mediastinal nodes are within normal limits as well. A right PICC line terminates just in the right side of the atrium. The axilla and base of neck are otherwise normal. The chest wall is unremarkable. The esophagus is within normal limits. The heart size is normal. Central great vessels are normal in caliber. No effusions. IMPRESSION: No acute abnormality to explain the patient's symptoms is identified. Thyromegaly with no focal masses. Electronically Signed   By: Gerome Samavid  Williams III M.D   On: 08/19/2015 16:23   Ct Abdomen Pelvis W Contrast  08/20/2015  CLINICAL DATA:  Nausea, vomiting, diarrhea, abdominal pain, diabetic ketoacidosis, UTI, question pneumonia on chest radiograph, vaginal abscess, vaginal pain, history diabetes mellitus, benign essential hypertension EXAM: CT ABDOMEN AND PELVIS WITH CONTRAST TECHNIQUE: Multidetector CT imaging of the abdomen and pelvis was performed using the standard protocol following bolus administration of intravenous contrast. Sagittal and coronal MPR images reconstructed from axial data set. CONTRAST:  50mL OMNIPAQUE IOHEXOL 300 MG/ML SOLN PO, 100mL OMNIPAQUE IOHEXOL 300 MG/ML SOLN IV COMPARISON:  05/31/2015 FINDINGS: Lung bases clear. Gallbladder surgically absent. Liver, spleen, pancreas, kidneys, and adrenal glands normal appearance. Few scattered respiratory motion artifacts. Normal appendix in RIGHT pelvis. Bladder, ureters, uterus and adnexa unremarkable. Thickening of distal esophagus. Oral contrast with reportedly administered but no bowel opacification is seen. Prominent food debris in stomach. Increased stool throughout colon. Bowel loops otherwise unremarkable. No definite mass, adenopathy, free air, free fluid, or inflammatory process. New line specifically, no vaginal or perivaginal fluid collections identified. Scattered normal sized mesenteric,  retroperitoneal and inguinal nodes. No hernia seen. Sclerotic focus LEFT ischium unchanged question bone island. No acute osseous findings. IMPRESSION: Mildly increased stool throughout colon and significant food debris in stomach. Wall thickening of distal esophagus, can be due to reflux or esophagitis, tumor unlikely at this age though not excluded by CT. No other definite intra-abdominal or intrapelvic abnormalities identified. Electronically Signed   By: Ulyses SouthwardMark  Boles M.D.   On: 08/20/2015 14:30   Dg Chest Port 1 View  08/17/2015  CLINICAL DATA:  Diabetic ketoacidosis.  Cough. EXAM: PORTABLE CHEST 1 VIEW COMPARISON:  None. FINDINGS: Cardiac silhouette is normal in size and configuration. Normal mediastinal and hilar contours. There are irregularly thickened bronchovascular/interstitial markings which predominate centrally. No lung consolidation is seen to suggest pneumonia. No pleural effusion or pneumothorax. Skeletal structures are unremarkable. IMPRESSION: 1. Bilateral thickened bronchovascular/interstitial markings. This could be due to diffuse bronchitis. Findings may reflect mild interstitial edema. No evidence of lobar pneumonia. Electronically Signed   By: Amie Portlandavid  Ormond M.D.   On: 08/17/2015 22:22     EKG: (Independently reviewed)  Sinus tachycardia with ventricular rate 158 bpm, QTC 441 ms, no ischemic changes but peaked T waves consistent with mildly elevated potassium   Assessment & Plan  Principal Problem:   DKA, type 1  -Likely related to ongoing noncompliance; patient reports was unable to afford insulins -Admit to stepdown/inpatient -In review of documentation in CareEverywhere patient has been prescribed Levemir as well as 70/30 insulin and does not take regularly/noncompliance documented -Continue insulin infusion -BMET q 4 hrs until AG closes and serum CO2 normalizes -Continue aggressive volume resuscitation -Allow noncaloric clears -Hemoglobin A1c last admission was greater  than 14 -Diabetes educator consultation -  Case management and social worker consultation to assist in obtaining follow-up and cost effective medications for this patient; it appears patient previously had been placed in a clinic yet continued to remain noncompliant-patient likely needs applications to apply for Medicaid and/or disability -Repeat ABG to see if pH has improved with current treatments    Active Problems:   Dehydration with hyponatremia -Secondary to DKA and pseudohyponatremia -Anticipate will correct with correction of dehydration and DKA -We'll go ahead and give another 2 L normal saline now    AKI  -Secondary to profound dehydration -As above    Graves' disease -During previous hospitalization TSH was low and T4/free thyroxine were elevated -Patient was resumed on Tapazole and propanolol -Heart rate was relatively well-controlled in the high 90s to low 100s on this regimen -Since having recurrent vomiting will begin Lopressor 2.5 mg IV q 6 hours and can adjust doses indicated -Patient reports does not consistently take medications at home -Monitor closely for evolution to thyrotoxic crisis in setting of acute DKA  -Check ultrasound thyroid -Urine pregnancy was negative so can begin methimazole once patient is able to tolerate POs without nausea and vomiting    Microcytic anemia -Likely related to anemia of chronic disease in setting of inadequately treated diabetes and thyroid disease due to noncompliance -Also suspected degree of malnutrition -Check anemia panel    Leukocytosis -Likely related to DKA process with low perfusion and dehydration -Patient does have abnormal urinalysis and previously was treated for yeast (Diflucan) which is not uncommon in women with poorly controlled diabetes -A second urinalysis has been obtained since patient has begun treatment in the ER which was much improved and does not appear to be consistent with infectious process    Benign  essential HTN -Previously has been well managed on propanolol which also aids in symptom management with her Luiz Blare' disease -May have a degree of substance withdrawal contributing to ongoing hypertension    Bipolar disorder  -In the past has been treated with Klonopin and/or Abilify based on care everywhere documentation -Suspect this may be contributing to patient's compliance issues -I also suspect that since she is not treating this condition that she is likely abusing other substances to self medicate despite her insisting that she does not use alcohol or other drugs and only smoked cigarettes -Check urine drug screen -May benefit from formal psychiatric evaluation to determine capacity-she definitely has no insight into how her behaviors are affecting her physical health    Functional constipation/diabetic gastroparesis -Likely a multifactorial issue related to ongoing dehydration from poorly controlled diabetes, diabetic gastroparesis, and suspected substance abuse related to narcotics given her documented history of insisting on only being given narcotics as opposed to NSAIDs or Tylenol during previous hospitalizations at other facilities -Begin twice a day Colace and q HS MiraLAX -May need other laxatives to promote bowel movements -Suspect previous diarrhea was functional in nature secondary to severe constipation noting C. difficile PCR was negative    Recent transaminitis -Had elevated AST and ALT which are likely related to low perfusion from dehydration -Given patient's social history will check HIV, RPR and acute hepatitis panel    Drug seeking behavior -Previously documented during previous hospitalizations with patient insisting on only getting IV narcotics and at one point became very manipulative stating she would not get a PICC line unless they gave her IV Dilaudid -Currently requesting narcotics -because she is having nausea and vomiting and has acute kidney injury I will  give her very low  dose IV morphine 2 mg every 2 hours but this needs to be quickly weaned and discontinued once renal function improved and patient can tolerate POs    DVT Prophylaxis: Lovenox   Family Communication:   No family at bedside  Code Status:   Full code  Condition:   Guarded  Discharge disposition: once medically stable anticipate discharge back to previous home environment   Time spent in minutes : 60      Jessica Tapia L. ANP on 08/25/2015 at 8:10 AM  You may contact me by going to [email or web address] - password [email or web address]  I am available from 7a-7p but please confirm I am on the schedule by going to Amion as above.   After 7p please contact night coverage person covering me after hours  [facility]

## 2015-08-25 NOTE — Progress Notes (Addendum)
Sts MSO4 causes itching so will change to Dilaudid 0.5 mg IV q 2 hrs  925 AM; is now become very manipulative when told her next dose of pain medicine would not be due for another hour and a half. She reported to the nurse "I'll just leave and go to another hospital". I suspect she needs psychiatric evaluation given her underlying bipolar disorder. I also suspect despite her continued protestations that she does not use narcotics or other substances that she is very tolerant of narcotics so we'll give a one-time dose of IV Dilaudid 2 mg and then she can resume Dilaudid as above. I will also request a formal psychiatric evaluation. Spoke with psychiatrist Dr. Elsie SaasJonnalagadda who will see the patient in consultation (possibly today but may be unable to see patient until tomorrow). I have also added when necessary IV Ativan assist with underlying anxiety.  Clarification: We have ordered a total of 4 L saline since admission and she received 2 L saline in the ER  UDS was positive for cocaine  1106: After multiple attempts IV team unable to place PICC line (after an attempt to place an ultrasound-guided peripheral line in patient's only extremity). She has a 20-gauge IV in the antecubital. She is now very frustrated and refusing all peripheral sticks and other IV attempts and once again was threatening to leave AMA. After discussing the seriousness of her illness and the option of a central line placement patient is agreeable to having a central line placed since this will allow her to have blood work drawn without peripheral sticks. We will cancel previously considered ABG and follow serum lactates.  Junious SilkAllison Ellis, ANP

## 2015-08-25 NOTE — Progress Notes (Addendum)
Spoke with patient briefly.  She was very sleepy with eyes closed.  Regular Coke at bedside.  She states that she has had diabetes for 2 years.  When I asked if she had insulin at home she states "yes" but only enough for one more day.  Explained to patient that she will need insulin Rx. Prior to d/c.  Will likely need medication assistance or Rx. For cheaper insulin such as Novolin 70/30.  No further recs. at this time.  Note that MD awaiting BMET.  Discussed with RN.   Thanks, Beryl MeagerJenny Clarrisa Kaylor, RN, BC-ADM Inpatient Diabetes Coordinator Pager [phone] (8a-5p)

## 2015-08-25 NOTE — ED Notes (Signed)
Pt. Screaming at phlebotomy staff and refusing to have blood draw completed. Two phlebotomist at bedside to attempt. RN and NT at bedside. Pt. Verbally combative.

## 2015-08-25 NOTE — ED Notes (Signed)
MD at bedside. 

## 2015-08-25 NOTE — ED Notes (Signed)
Pt is very combative and all over the room; pt demands water and ice constantly; Pt is tachy on monitor but will not keep EKG leads on ; Md at bedside

## 2015-08-25 NOTE — Progress Notes (Signed)
Inpatient Diabetes Program Recommendations  AACE/ADA: New Consensus Statement on Inpatient Glycemic Control (2015)  Target Ranges:  Prepandial:   less than 140 mg/dL      Peak postprandial:   less than 180 mg/dL (1-2 hours)      Critically ill patients:  140 - 180 mg/dL   Review of Glycemic Control:  Results for Jessica Tapia, Jessica Tapia (MRN [id number]) as of 08/25/2015 12:57  Ref. Range 08/25/2015 06:49 08/25/2015 08:01 08/25/2015 09:15 08/25/2015 11:11 08/25/2015 12:38  Glucose-Capillary Latest Ref Range: 65-99 mg/dL >409>600 (HH) 811565 (HH) 914305 (H) 112 (H) 124 (H)   Diabetes history:  Type 1 diabetes Outpatient Diabetes medications: Lantus 24 units bid, Novolog 1-20 units tid with meals Current orders for Inpatient glycemic control:  IV insulin due to DKA  Inpatient Diabetes Program Recommendations:    Note that patient left [facility] hospital AMA on 08/23/15.  Note that patient does not have insurance and that affordability of insulin may be a contributing factor to admits.  MD has also ordered Psych consult.  Will follow.  May need 70/30 regimen upon d/c of insulin drip which [facility] per vial.  Thanks, Beryl MeagerJenny Sherena Machorro, RN, BC-ADM Inpatient Diabetes Coordinator Pager [phone] (8a-5p)

## 2015-08-25 NOTE — Progress Notes (Signed)
Peripherally Inserted Central Catheter/Midline Placement  The IV Nurse has discussed with the patient and/or persons authorized to consent for the patient, the purpose of this procedure and the potential benefits and risks involved with this procedure.  The benefits include less needle sticks, lab draws from the catheter and patient may be discharged home with the catheter.  Risks include, but not limited to, infection, bleeding, blood clot (thrombus formation), and puncture of an artery; nerve damage and irregular heat beat.  Alternatives to this procedure were also discussed.  PICC/Midline Placement Documentation        Maximino GreenlandLumban, Jamaury Gumz Albarece 08/25/2015, 11:18 AM

## 2015-08-25 NOTE — Procedures (Signed)
Central Venous Catheter Insertion Procedure Note Jessica Tapia [id number] 12/30/1985  Procedure: Insertion of Central Venous Catheter Indications: Drug and/or fluid administration  Procedure Details Consent: Risks of procedure as well as the alternatives and risks of each were explained to the (patient/caregiver).  Consent for procedure obtained. Time Out: Verified patient identification, verified procedure, site/side was marked, verified correct patient position, special equipment/implants available, medications/allergies/relevent history reviewed, required imaging and test results available.  Performed  Maximum sterile technique was used including antiseptics, cap, gloves, gown, hand hygiene, mask and sheet. Skin prep: Chlorhexidine; local anesthetic administered A antimicrobial bonded/coated triple lumen catheter was placed in the right subclavian vein using the Seldinger technique.  Evaluation Blood flow good Complications: No apparent complications Patient did tolerate procedure well. Chest X-ray ordered to verify placement.  CXR: pending.  Jessica Tapia 08/25/2015, 2:21 PM

## 2015-08-25 NOTE — ED Notes (Signed)
IV team at bedside 

## 2015-08-25 NOTE — Clinical Social Work Note (Signed)
CSW received consult for patient not being able to afford medication, case manager can assist with medication costs.  CSW to sign off.  Ervin KnackEric R. Master Touchet, MSW, Theresia MajorsLCSWA [phone] 08/25/2015 3:11 PM

## 2015-08-25 NOTE — ED Notes (Signed)
Pt refused to stay in bed and continues to get up with out assistance; pt is filling her cup with water at the sink; Pt has been told repeatedly by  RN to make sure she is careful with IV line;

## 2015-08-25 NOTE — ED Provider Notes (Addendum)
Angiocath insertion Performed by: Gilda CreasePOLLINA, Jessica Hazelbaker J.  Consent: Verbal consent obtained. Risks and benefits: risks, benefits and alternatives were discussed Time out: Immediately prior to procedure a "time out" was called to verify the correct patient, procedure, equipment, support staff and site/side marked as required.  Preparation: Patient was prepped and draped in the usual sterile fashion.  Vein Location: R upper arm, peripheral IV  Ultrasound Guided  Gauge: 20  Normal blood return and flush without difficulty Patient tolerance: Patient tolerated the procedure well with no immediate complications.     Gilda Creasehristopher J Ankush Gintz, MD 08/25/15 0522  Gilda Creasehristopher J Teddi Badalamenti, MD 09/14/15 [phone]

## 2015-08-25 NOTE — ED Notes (Signed)
Pt given sprite zero x2 within 10 minutes. And water x2. Pt informed that she needs water more than sprite, as to that will hydrate her better. Pt agreed. Pt given warm blanket, sts that she is comfortable and is resting.

## 2015-08-25 NOTE — Progress Notes (Addendum)
AG down to 10 CO2 still slightly low at 17. We'll go ahead and transition off of insulin infusion to 70/30 insulin 40 units twice a day and continue to check CBGs closely every 4 hours with resistant coverage. Will DC'd Xtra's containing fluids but continue normal saline at 150 mL per hour since patient still appears to be quite volume depleted. Plan to follow serial BMET through the night and if anion gap remains closed can change to daily.  Junious SilkAllison Alizee Maple, ANP

## 2015-08-25 NOTE — Progress Notes (Signed)
PICC line placement attempted twice by this writer, unable to thread wire. Renee attempted twice too and unable to thread wire. Patient is uncooperative at first and we were able to calm her down and she got restless after both attepmts, Primary RN made aware.

## 2015-08-25 NOTE — ED Notes (Signed)
RN attempted IV; Md at bedside with Ultrasound

## 2015-08-25 NOTE — ED Provider Notes (Signed)
CSN: [id number]     Arrival date & time 08/25/15  8657 History   First MD Initiated Contact with Patient 08/25/15 0451     Chief Complaint  Patient presents with  . Diabetic Ketoacidosis     (Consider location/radiation/quality/duration/timing/severity/associated sxs/prior Treatment) HPI Comments: Patient is a 30 year old female with history of type 1 diabetes. She presents for evaluation of elevated blood sugar, body aches, and feeling short of breath. This started earlier this evening. She was recently admitted for diabetic ketoacidosis, then apparently signed out AGAINST MEDICAL ADVICE prior to completion of treatment. Since she has been home her sugars have been running high. She reports taking her insulin. She denies any fevers or chills. She reports nausea and vomiting and one episode of loose stool.  The history is provided by the patient.    Past Medical History  Diagnosis Date  . Diabetes mellitus without complication [facility])    Past Surgical History  Procedure Laterality Date  . Amputation arm     No family history on file. Social History  Substance Use Topics  . Smoking status: Never Smoker   . Smokeless tobacco: None  . Alcohol Use: No   OB History    No data available     Review of Systems  All other systems reviewed and are negative.     Allergies  Sulfa antibiotics  Home Medications   Prior to Admission medications   Medication Sig Start Date End Date Taking? Authorizing Provider  insulin aspart (NOVOLOG) 100 UNIT/ML injection Inject 1-10 Units into the skin 3 (three) times daily before meals. Per sliding scale    Historical Provider, MD  insulin detemir (LEVEMIR) 100 UNIT/ML injection Inject 12 Units into the skin 2 (two) times daily.    Historical Provider, MD  promethazine (PHENERGAN) 25 MG tablet Take 1 tablet (25 mg total) by mouth every 6 (six) hours as needed for nausea or vomiting. Patient not taking: Reported on 08/17/2015 05/31/15   Azalia Bilis,  MD   BP 144/84 mmHg  Pulse 144  Resp 38  Ht  (1.753 m)  Wt 140 lb (63.504 kg)  BMI 20.67 kg/m2  SpO2 100% Physical Exam  Constitutional: She is oriented to person, place, and time. She appears well-developed and well-nourished. No distress.  Patient appears anxious and is hyperventilating.  HENT:  Head: Normocephalic and atraumatic.  Mucous membranes are dry.  Neck: Normal range of motion. Neck supple.  Cardiovascular: Normal rate and regular rhythm.  Exam reveals no gallop and no friction rub.   No murmur heard. Pulmonary/Chest: Effort normal. She has no wheezes.  Patient is exhibiting Kussmaul type respirations.  Abdominal: Soft. Bowel sounds are normal. She exhibits no distension. There is no tenderness.  Musculoskeletal: Normal range of motion.  Neurological: She is alert and oriented to person, place, and time.  Skin: Skin is warm and dry. She is not diaphoretic.  Skin has poor turgor.  Nursing note and vitals reviewed.   ED Course  Procedures (including critical care time) Labs Review Labs Reviewed  URINALYSIS, ROUTINE W REFLEX MICROSCOPIC (NOT AT [facility])  CBC  BASIC METABOLIC PANEL  CBG MONITORING, ED    Imaging Review No results found. I have personally reviewed and evaluated these images and lab results as part of my medical decision-making.   EKG Interpretation   Date/Time:  Friday August 25 2015 06:45:14 EST Ventricular Rate:  158 PR Interval:  69 QRS Duration: 83 QT Interval:  272 QTC Calculation: 441 R Axis:  72 Text Interpretation:  Sinus tachycardia Confirmed by Emine Lopata  MD, Leaira Fullam  [phone]) on 08/25/2015 7:05:07 AM      MDM   Final diagnoses:  None    Patient is a 30 year old female with history of type 1 diabetes. She presents with complaints of hurting all over. She was recently admitted for diabetic ketoacidosis, then signed out AGAINST MEDICAL ADVICE. She returns now with elevated sugars and feeling sicker. Her workup reveals a CO2 of  7 with an anion gap of 27. She was a difficult stick and I was unable to obtain IV access through use of an ultrasound. Dr. Blinda LeatherwoodPollina was able to obtain IV access with the ultrasound machine. She is in the process of being liberally hydrated with normal saline and the glucose stabilizer initiated.  ABG reveals a pH of 7.03. Medicine has been consulted and she will be admitted per Dr. Toniann FailKakrakandy.  CRITICAL CARE Performed by: Geoffery LyonseLo, Lemmie Vanlanen Total critical care time: 45 minutes Critical care time was exclusive of separately billable procedures and treating other patients. Critical care was necessary to treat or prevent imminent or life-threatening deterioration. Critical care was time spent personally by me on the following activities: development of treatment plan with patient and/or surrogate as well as nursing, discussions with consultants, evaluation of patient's response to treatment, examination of patient, obtaining history from patient or surrogate, ordering and performing treatments and interventions, ordering and review of laboratory studies, ordering and review of radiographic studies, pulse oximetry and re-evaluation of patient's condition.      Geoffery Lyonsouglas Rayann Jolley, MD 08/25/15 [phone]

## 2015-08-25 NOTE — ED Notes (Signed)
Per EMS pt was seen for DKA and was admitted x 7 days; pt walked out AMA according to family x 2 days ago; Pt drank 450 cc of sterile water in EMS truck and vomited it back up; Pt states she just got tired of being here which is why she left AMA; Pt refused IV and monitor for EMS; pt comes in dishevled and slightly combactive on arrival pt a&ox4 on arrival; Pt ambulates to bathroom with on assist on arrival

## 2015-08-25 NOTE — Progress Notes (Signed)
Utilization review completed. Endia Moncur, RN, BSN. 

## 2015-08-25 NOTE — Progress Notes (Addendum)
Central line placed by PCCM (thanks for prompt response)- await confirmation of placement before use/obtain labs. Pt continues to be very demanding and manipulative. She was informed by RN that can't have diet started until labs confirm AG closed. Tolerated PO Coke without difficulty in ER so orders given for 1x Coke and pack of saltines until labs returned. Pt subsequently began to complain of itching, again stating she was going to leave AMA if "something isn't done!!!" Benadryl prn ordered. SBP has decreased to 98 so will give additional NS bolus 500 cc now  Junious SilkAllison Aquilla Shambley, ANP

## 2015-08-25 NOTE — Discharge Planning (Signed)
CM consult to assist pt with PCP and medication.  [facility].  [facility] and [facility].

## 2015-08-26 DIAGNOSIS — D509 Iron deficiency anemia, unspecified: Secondary | ICD-10-CM

## 2015-08-26 DIAGNOSIS — F1410 Cocaine abuse, uncomplicated: Secondary | ICD-10-CM

## 2015-08-26 DIAGNOSIS — E0590 Thyrotoxicosis, unspecified without thyrotoxic crisis or storm: Secondary | ICD-10-CM | POA: Insufficient documentation

## 2015-08-26 DIAGNOSIS — F319 Bipolar disorder, unspecified: Secondary | ICD-10-CM

## 2015-08-26 DIAGNOSIS — E1310 Other specified diabetes mellitus with ketoacidosis without coma: Secondary | ICD-10-CM

## 2015-08-26 DIAGNOSIS — F1910 Other psychoactive substance abuse, uncomplicated: Secondary | ICD-10-CM

## 2015-08-26 DIAGNOSIS — E1010 Type 1 diabetes mellitus with ketoacidosis without coma: Principal | ICD-10-CM

## 2015-08-26 DIAGNOSIS — I10 Essential (primary) hypertension: Secondary | ICD-10-CM

## 2015-08-26 DIAGNOSIS — E0500 Thyrotoxicosis with diffuse goiter without thyrotoxic crisis or storm: Secondary | ICD-10-CM

## 2015-08-26 DIAGNOSIS — E871 Hypo-osmolality and hyponatremia: Secondary | ICD-10-CM

## 2015-08-26 DIAGNOSIS — E1110 Type 2 diabetes mellitus with ketoacidosis without coma: Secondary | ICD-10-CM | POA: Insufficient documentation

## 2015-08-26 LAB — URINE CULTURE

## 2015-08-26 LAB — HEPATITIS PANEL, ACUTE
HEP A IGM: NEGATIVE
HEP B S AG: NEGATIVE
Hep B C IgM: NEGATIVE

## 2015-08-26 LAB — HIV ANTIBODY (ROUTINE TESTING W REFLEX): HIV SCREEN 4TH GENERATION: NONREACTIVE

## 2015-08-26 LAB — RPR: RPR Ser Ql: NONREACTIVE

## 2015-08-26 LAB — GLUCOSE, CAPILLARY
GLUCOSE-CAPILLARY: 266 mg/dL — AB (ref 65–99)
Glucose-Capillary: 367 mg/dL — ABNORMAL HIGH (ref 65–99)

## 2015-08-26 MED ORDER — PROPRANOLOL HCL ER 60 MG PO CP24
60.0000 mg | ORAL_CAPSULE | Freq: Every day | ORAL | Status: AC
Start: 2015-08-26 — End: ?

## 2015-08-26 MED ORDER — INSULIN GLARGINE 100 UNIT/ML ~~LOC~~ SOLN: 30.0000 [IU] | Freq: Two times a day (BID) | SUBCUTANEOUS | Status: AC

## 2015-08-26 MED ORDER — METHIMAZOLE 10 MG PO TABS: 10.0000 mg | ORAL_TABLET | Freq: Three times a day (TID) | ORAL | Status: AC

## 2015-08-26 MED ORDER — DIPHENHYDRAMINE HCL 25 MG PO CAPS: 50.0000 mg | ORAL_CAPSULE | ORAL | Status: DC | PRN

## 2015-08-26 NOTE — Discharge Summary (Signed)
Physician Discharge Summary  Jessica Tapia JYN:[id number] DOB: 04/25/1986 DOA: 08/25/2015  PCP: No primary care provider on file.  Admit date: 08/25/2015 Discharge date: 08/26/2015  Time spent: 30 minutes  Recommendations for Outpatient Follow-up:  1. Discharge home. patient reports her sister will be taking her to see a doctor next week    Discharge Diagnoses:  Principal Problem:   DKA, type 1 (HCC)   Active Problems:   Benign essential HTN   Graves' disease   Dehydration with hyponatremia   AKI (acute kidney injury) (HCC)   Bipolar disorder (HCC)   Functional constipation   Microcytic anemia   Leukocytosis   Drug-seeking behavior   Diabetic gastroparesis (HCC)   Cocaine abuse   Encounter for central line placement   Discharge Condition: fair  Diet recommendation: diabetic  Filed Weights   08/25/15 0504  Weight: 63.504 kg (140 lb)    History of present illness:  30 year old female with uncontrolled diabetes mellitus (A1c of 14.2) recently admitted for DKA and signed out AMA on 3/8. She also has history of Graves' disease and has been noncompliant to her medications. Patient had symptoms of nausea and vomiting and persistently asking for pain medications and antiemetics during prior hospitalization. On the day of admission EMS was called patient's home as she was found to be severely dehydrated and he should, combative. She was admitted at [facility] in December 2016 for DKA. She informs that she moved from [place] in December 2016 9 was there for 3 years). She left the hospital AMA as well. In the ED she was tachycardic with heart rate of 144, tachypneic, blood work showing sodium of 131, K of 5.6, BUN of 28, creatinine 1.28, serum glucose of 812) and an gap of 26. EKG showed sinus tachycardia with no ST T changes and normal QTC. One was negative. 11.9 K, hemoglobin of 11.6 and normal platelets. UA showed>1000 glucose and ketones. ABG showed metabolic acidosis. Urine  drug screen was positive for cocaine and opiates. She was given 2 L IV normal saline bolus and started on glucose stabilizer with insulin drip. She had poor IV access requiring central line. Anion gap subsequently closed and was transitioned to 70/30 insulin. Tonight patient frequently refusing CBG monitoring and blood draw stating she needs pain medications and antiemetics.   Hospital Course:  DKA type I In the setting of medication and dietary noncompliance. Reported on admission that she was unable to afford insulins but told me today that her doctor in [place] was giving her refills and that she was taking her insulins although her CBGs were in the range of 250-350. She reports taking glargine 20 units twice a day along with sliding scale. Her anion gap has now closed and patient has received adequate IV hydration. She is eating without difficulty and does not have any nausea or vomiting. Case manager and social worker consulted to help with medication and [facility]. -Her CBG this morning was 266. I would discharge her on glargine 30 units twice a day and recommend to continue her sliding scale. Apparently patient refused follow-up at the wellness Center,   Severe dehydration with pseudohyponatremia and history of present illness Secondary to DKA. Resolved with IV fluids.  Graves' disease Was previously on Tapazole and atenolol. Heart rate has been in 100-120s. During her recent hospital edges and her TSH was low with elevated free T4. We resumed her Tapazole. I have added on propranolol and prescribed the medications. In pregnancy  was negative.  Microcytic anemia Suspect anemia of chronic disease. Follow-up as outpatient.  Bipolar disorder with severe narcotic seeking behavior In persistently asking for pain medications (specifically asking for Dilaudid). She winces to pain on minimal abdominal exam but when distracted she is not in any pain. Her urine  drug screen was positive for opiates and cocaine, however she denies using them. -It is reported that she was previously treated with Klonopin and Abilify for her bipolar disorder. Patient was irritable and agitated during the night and was  cursing me this morning when I refused to give her pain medications and told her that she would be discharged.  Patient is clinically stable and can be discharged home.  Procedures:  None  Consultations:  None  CODE STATUS: Full code  Family communication: None at bedside   Discharge Exam: Filed Vitals:   08/25/15 2335 08/26/15 0000  BP:    Pulse:    Temp: 97.9 F (36.6 C)   Resp:  24    General: Young female not in distress HEENT: No pallor, moist mucosa Chest: Clear bilaterally, no added sounds CVS: None and S2 tachycardic, no murmurs rub or gallop GI: Soft, nondistended, nontender, bowel sounds present Extremities: Warm, no edema, left upper extremity amputation CNS: Alert and oriented  Discharge Instructions    Current Discharge Medication List    START taking these medications   Details  methimazole (TAPAZOLE) 10 MG tablet Take 1 tablet (10 mg total) by mouth 3 (three) times daily. Qty: 90 tablet, Refills: 0    propranolol ER (INDERAL LA) 60 MG 24 hr capsule Take 1 capsule (60 mg total) by mouth daily. Qty: 30 capsule, Refills: 0      CONTINUE these medications which have CHANGED   Details  insulin glargine (LANTUS) 100 UNIT/ML injection Inject 0.3 mLs (30 Units total) into the skin 2 (two) times daily. Qty: 10 mL, Refills: 11      CONTINUE these medications which have NOT CHANGED   Details  insulin aspart (NOVOLOG) 100 UNIT/ML injection Inject 1-20 Units into the skin 3 (three) times daily before meals. Per sliding scale      STOP taking these medications     promethazine (PHENERGAN) 25 MG tablet        Allergies  Allergen Reactions  . Sulfa Antibiotics     Reaction: unknown    Follow-up Information     Please follow up.   Why:  patient informs that her sister will take her to see a physician next week       The results of significant diagnostics from this hospitalization (including imaging, microbiology, ancillary and laboratory) are listed below for reference.    Significant Diagnostic Studies: Ct Chest Wo Contrast  08/19/2015  CLINICAL DATA:  Left-sided chest pain. EXAM: CT CHEST WITHOUT CONTRAST TECHNIQUE: Multidetector CT imaging of the chest was performed following the standard protocol without IV contrast. COMPARISON:  None. FINDINGS: No pulmonary nodules, masses, or infiltrates. There is mucus within the trachea. There is enlargement of the thyroid with no focal masses. Triangular soft tissue in the anterior mediastinum is consistent with residual thymus. The borders are concave with no convexity. Evaluation of the hila is limited without contrast but no hilar adenopathy is identified. The mediastinal nodes are within normal limits as well. A right PICC line terminates just in the right side of the atrium. The axilla and base of neck are otherwise normal. The chest wall is unremarkable. The esophagus is within normal limits.  The heart size is normal. Central great vessels are normal in caliber. No effusions. IMPRESSION: No acute abnormality to explain the patient's symptoms is identified. Thyromegaly with no focal masses. Electronically Signed   By: Gerome Sam III M.D   On: 08/19/2015 16:23   Ct Abdomen Pelvis W Contrast  08/20/2015  CLINICAL DATA:  Nausea, vomiting, diarrhea, abdominal pain, diabetic ketoacidosis, UTI, question pneumonia on chest radiograph, vaginal abscess, vaginal pain, history diabetes mellitus, benign essential hypertension EXAM: CT ABDOMEN AND PELVIS WITH CONTRAST TECHNIQUE: Multidetector CT imaging of the abdomen and pelvis was performed using the standard protocol following bolus administration of intravenous contrast. Sagittal and coronal MPR images reconstructed  from axial data set. CONTRAST:  50mL OMNIPAQUE IOHEXOL 300 MG/ML SOLN PO, OMNIPAQUE IOHEXOL 300 MG/ML SOLN IV COMPARISON:  05/31/2015 FINDINGS: Lung bases clear. Gallbladder surgically absent. Liver, spleen, pancreas, kidneys, and adrenal glands normal appearance. Few scattered respiratory motion artifacts. Normal appendix in RIGHT pelvis. Bladder, ureters, uterus and adnexa unremarkable. Thickening of distal esophagus. Oral contrast with reportedly administered but no bowel opacification is seen. Prominent food debris in stomach. Increased stool throughout colon. Bowel loops otherwise unremarkable. No definite mass, adenopathy, free air, free fluid, or inflammatory process. New line specifically, no vaginal or perivaginal fluid collections identified. Scattered normal sized mesenteric, retroperitoneal and inguinal nodes. No hernia seen. Sclerotic focus LEFT ischium unchanged question bone island. No acute osseous findings. IMPRESSION: Mildly increased stool throughout colon and significant food debris in stomach. Wall thickening of distal esophagus, can be due to reflux or esophagitis, tumor unlikely at this age though not excluded by CT. No other definite intra-abdominal or intrapelvic abnormalities identified. Electronically Signed   By: Ulyses Southward M.D.   On: 08/20/2015 14:30   Dg Chest Port 1 View  08/25/2015  CLINICAL DATA:  Central line placement EXAM: PORTABLE CHEST 1 VIEW COMPARISON:  08/19/2015 FINDINGS: Cardiomediastinal silhouette is stable. No acute infiltrate or pleural effusion. No pulmonary edema. There is right subclavian central line with tip in right atrium. For distal SVC position the central line could be retracted about 2 cm. No pneumothorax. IMPRESSION: No infiltrate or pulmonary edema. Left subclavian central line with tip in right atrium. No pneumothorax. Electronically Signed   By: Natasha Mead M.D.   On: 08/25/2015 14:37   Dg Chest Port 1 View  08/17/2015  CLINICAL DATA:  Diabetic  ketoacidosis.  Cough. EXAM: PORTABLE CHEST 1 VIEW COMPARISON:  None. FINDINGS: Cardiac silhouette is normal in size and configuration. Normal mediastinal and hilar contours. There are irregularly thickened bronchovascular/interstitial markings which predominate centrally. No lung consolidation is seen to suggest pneumonia. No pleural effusion or pneumothorax. Skeletal structures are unremarkable. IMPRESSION: 1. Bilateral thickened bronchovascular/interstitial markings. This could be due to diffuse bronchitis. Findings may reflect mild interstitial edema. No evidence of lobar pneumonia. Electronically Signed   By: Amie Portland M.D.   On: 08/17/2015 22:22   US Thyroid  08/25/2015  CLINICAL DATA:  Hyperthyroidism. EXAM: THYROID ULTRASOUND TECHNIQUE: Ultrasound examination of the thyroid gland and adjacent soft tissues was performed. COMPARISON:  Chest CT 08/19/2015 FINDINGS: Right thyroid lobe Measurements: 5.3 x 3.0 x 2.5 cm. Thyroid tissue is enlarged and heterogeneous. Slightly increased vascularity. No distinct right thyroid nodules. Left thyroid lobe Measurements: 5.7 x 1.9 x 1.8 cm. Thyroid tissue is prominent for size and heterogeneous. No distinct left thyroid nodules. Isthmus Thickness: 0.9 cm.  No nodules visualized. Lymphadenopathy None visualized. IMPRESSION: Thyroid tissue is prominent for size and heterogeneous. No distinct  thyroid nodules. Electronically Signed   By: Richarda Overlie M.D.   On: 08/25/2015 08:51    Microbiology: Recent Results (from the past 240 hour(s))  Urine culture     Status: None   Collection Time: 08/17/15 10:05 PM  Result Value Ref Range Status   Specimen Description URINE, CLEAN CATCH  Final   Special Requests Immunocompromised  Final   Culture   Final    MULTIPLE SPECIES PRESENT, SUGGEST RECOLLECTION Performed at [facility]    Report Status 08/19/2015 FINAL  Final  MRSA PCR Screening     Status: Abnormal   Collection Time: 08/18/15 12:05 AM  Result Value  Ref Range Status   MRSA by PCR POSITIVE (A) NEGATIVE Final    Comment:        The GeneXpert MRSA Assay (FDA approved for NASAL specimens only), is one component of a comprehensive MRSA colonization surveillance program. It is not intended to diagnose MRSA infection nor to guide or monitor treatment for MRSA infections. RESULT CALLED TO, READ BACK BY AND VERIFIED WITH: A. [facility] RN AT 0340 ON 03.03.17 BY SHUEA   C difficile quick scan w PCR reflex     Status: None   Collection Time: 08/23/15  4:10 PM  Result Value Ref Range Status   C Diff antigen NEGATIVE NEGATIVE Final   C Diff toxin NEGATIVE NEGATIVE Final   C Diff interpretation Negative for toxigenic C. difficile  Final     Labs: Basic Metabolic Panel:  Recent Labs Lab 08/20/15 0445  08/22/15 0508 08/25/15 0530 08/25/15 0730 08/25/15 1518 08/25/15 1807 08/25/15 2107  NA 132*  < > 136 131*  --  135 133* 138  K 4.5  < > 4.0 5.6*  --  3.9 3.5 3.4*  CL 99*  < > 101 97*  --  108 109 112*  CO2 25  < > 26 7*  --  17* 16* 17*  GLUCOSE 386*  < > 117* 812*  --  155* 168* 88  BUN 14  < > 17 28*  --  14 11 9   CREATININE 0.50  < > 0.39* 1.45*  --  0.52 0.56 0.59  CALCIUM 8.9  < > 9.1 9.2  --  8.5* 8.0* 8.4*  MG 1.7  --   --   --  1.7  --   --   --   PHOS  --   --   --   --  3.4  --   --   --   < > = values in this interval not displayed. Liver Function Tests:  Recent Labs Lab 08/21/15 0549  AST 31  ALT 46  ALKPHOS 186*  BILITOT 0.2*  PROT 6.5  ALBUMIN 2.9*   No results for input(s): LIPASE, AMYLASE in the last 168 hours. No results for input(s): AMMONIA in the last 168 hours. CBC:  Recent Labs Lab 08/20/15 0445 08/21/15 0549 08/22/15 0508 08/25/15 0530  WBC 3.8* 3.7* 4.4 11.9*  HGB 10.4* 10.8* 11.7* 11.6*  HCT 33.4* 34.7* 37.4 37.2  MCV 73.6* 73.5* 73.3* 75.0*  PLT 145* 160 167 262   Cardiac Enzymes:  Recent Labs Lab 08/25/15 0530 08/25/15 1517  CKTOTAL  --  63  TROPONINI <0.03  --    BNP: BNP  (last 3 results) No results for input(s): BNP in the last 8760 hours.  ProBNP (last 3 results) No results for input(s): PROBNP in the last 8760 hours.  CBG:  Recent Labs Lab  08/25/15 1719 08/25/15 1814 08/25/15 1901 08/25/15 2005 08/25/15 2339  GLUCAP 169* 147* 130* 185* 367*       Signed:  Eddie North MD.  [facility] 08/26/2015, 8:52 AM

## 2015-08-26 NOTE — Progress Notes (Signed)
Patient sitting in bed c/o chronic pain in central line site. "Please give me my pain medication". Dilaudid 0.5mg  given thru central line and flushed per patients request following administration. "Can I get my Benadryl, Phenergan and Ativan too?" Ativan given PO and she stated "I need it thru this line." Ativan IV not ordered. Benadryl can be given at 2200. Phenergan and Dilaudid will be given at 0000. Patient requesting graham crackers and milk, she ate 4 packs of graham crackers, then cup of beef bouillion with crackers, then diner from microwave, fruit cups. Will continue to medicate and access.

## 2015-08-26 NOTE — Care Management Note (Signed)
Case Management Note  Patient Details  Name: Chancy Claros MRN: [id number] Date of Birth: 03-Feb-1986  Subjective/Objective:                  DKA Action/Plan: Discharge planning Expected Discharge Date:  08/25/15               Expected Discharge Plan:  Home/Self Care  In-House Referral:  Clinical Social Work  Discharge planning Services  CM Consult, [place] Program, Medication Assistance  Post Acute Care Choice:    Choice offered to:     DME Arranged:    DME Agency:     HH Arranged:    [place] Agency:     Status of Service:  Completed, signed off  Medicare Important Message Given:    Date Medicare IM Given:    Medicare IM give by:    Date Additional Medicare IM Given:    Additional Medicare Important Message give by:     If discussed at [place] of Stay Meetings, dates discussed:    Additional Comments: CM met with pt in room and reminded her I am the same CM who met with her at [facility] on Wednesday 08/23/15; pt left AMA on the same day.  Pt states she has not used the [facility] letter I gave to her with list of participating pharmacies; CM gave copy of [place] and list of participating pharmacies and IF she has not used it as she states it is still valid thru 08/28/15.  CM also gave pt copy of the [facility] pamphlet with the same information and pt verbalized understanding of parameters of both.  Unfortunately, pt has warrant for arrest by [employer] upon discharge so pt will use walk-in clinic when she is released by [employer]. Marland Kitchen  No other CM needs were communicated. Dellie Catholic, RN 08/26/2015, 10:53 AM

## 2015-08-26 NOTE — Progress Notes (Signed)
Patient refusing to have vital signs measured.  States, "I'm in too much pain."

## 2015-08-26 NOTE — Progress Notes (Signed)
Patient refuses to have CBG checked.  Advised of effect of not knowing blood sugar.  Will notify MD>

## 2015-08-26 NOTE — Progress Notes (Signed)
Central line discontinued as per order and patient advised to remain in bed x 1 hour.  This nurse was notified that patient has warrants out for her arrest and would be taken into custody once she is discharged.  Discharge education done including the following:  Instructions on medication, wellness clinic information, signs and symptoms of hyper-hypo glycemia, and diabetes information.  Patient not willing to visit clinic at this time.  Please refer to Care Management note written 08/23/2015.

## 2015-08-26 NOTE — Progress Notes (Signed)
Patient advised that central and PIV lines will be removed.  Stated, "the doctor doesn't know what he's doing."  Upset re:  not having pain medication to take.  Encouraged patient to seek private medical doctor upon discharge who would be able to see to her diabetic, mental, and physical needs as she was no longer acutely ill.  States, "No one understands me."  Is shouting explitives at most medical personal.

## 2015-08-26 NOTE — Progress Notes (Signed)
Patient sleeping on side and Central Line kept beeping [place] on patient side, when I asked patient to lay on her back, she screamed "No I'm not, this is driving me crazy." She reached in and pulled her telemetry box off and slammed it to the floor. "Call my doctor, I want to c my doctor NOW." Elray McgregorMary Lynch NP notified and stated she doesn't know this patient but she will come to c her. Patient stated she was ready to go home. She left AMA twice already this week. Patient refused her 0100 lab draw. Another RN gave her medication at 0300. She continues to scream out loud "I need my NURSE."  Patient next to her was disturbed by her. Will close patient's door and wait for NP to get here.

## 2015-08-26 NOTE — Progress Notes (Signed)
Patient discharged ambulatory as per her request accompanied by nurse tech to primary exit with discharge instructions and prescription in hand.

## 2015-08-28 LAB — C-PEPTIDE: C-Peptide: <0.1 ng/mL — ABNORMAL LOW (ref 1.1–4.4)

## 2015-09-25 NOTE — Discharge Summary (Signed)
Physician Discharge Summary  Jessica Paraiffany Nebergall [id number] DOB: 02/15/1986 DOA: 08/17/2015  PCP: No primary care provider on file.  Admit date: 08/17/2015 Patient left AMA on: 08/23/2015   Discharge Diagnoses:  Principal Problem:   DM type 1, not at goal [facility](HCC) Active Problems:   MRSA carrier   UTI (urinary tract infection)   Benign essential HTN   Thyromegaly   Low TSH level   Graves' disease   Vaginal pain   Wt Readings from Last 3 Encounters:  08/25/15 63.504 kg (140 lb)  08/18/15 65.4 kg (144 lb 2.9 oz)    History of present illness:  Jessica Tapia is a pleasant 30 year old female with diabetes type I poorly controlled hemoglobin A1c 13.7/Graves' disease causing hyperthyroidism patient off methimazole/propranolol for the last 2 years/essential hypertension/status post left above elbow amputation, who came in with nausea, vomiting, diarrhea and abdominal pain and was found to have diabetic ketoacidosis with UTI/suggestion of pneumonia on chest x-ray and sepsis like picture. Patient also noted to have hyperthyroidism with low TSH and high free T4. Her MRSA PCR was positive. Her workup included CT chest/CT abdomen and pelvis with unremarkable findings. There was concern for valvular abscess which was not seen on CT. Patient was placed on DKA protocol with improvement. She was treated as if septic with broad-spectrum antibiotics which have been narrowed to Levaquin/fluconazole(?vaginal candidiasis), was started on propranolol/methimazole, and she is now on Levemir/SSI and sugars are showing improvement. Having ongoing diarrhea, not improved with imodium.   Hospital Course:   DKA (diabetic ketoacidoses) (HCC)(resolved)/diabetes mellitus type 1, still very hyperglycemic.  Poorly controlled long-term, hemoglobin A1c 14.2. Was attempting to increase her insulin to correct her hyperglycemia, however, she left AMA.    Diarrhea/Nausea and vomiting/Abdominal pain. She had tremors and appears  somewhat anxious. I suspect that she was withdrawing from a medication or drug, although she denied alcohol, opiate, and benzodiazepine dependence, alternatively, these symptoms may be related to her hyperthyroidism. Mild AST:ALT elevation suggestive of EtOH use and trended down gradually.  Unable to obtain C. Diff PCR.    MRSA carrier/UTI (urinary tract infection)/PNA (pneumonia)/vaginal pain ?yeast.  Continued Levaquin to complete 7 days of antibiotics, last dose 3/9.  Continued Fluconazole.  Graves' disease/Low tsh/thyromegaly (TSH 0.012, free T4 1.94).  Continued Propranolol, Methimazole and recommended Endocrinology outpatient  Essential Htn  BP better controlled  Continue Propranolol, Hydralazine as needed. Pain control.  Hypokalemia/hypomagnesemia, resolved with supplementation  Microcytic anemia, patient left AMA before work up could be completed  Consultants:  None  Procedures:  None  Antibiotics:  Levofloxacin  Discharge Exam: Filed Vitals:   08/23/15 0548 08/23/15 1603  BP: 103/79 107/83  Pulse: 116 94  Temp: 97.8 F (36.6 C) 98.2 F (36.8 C)  Resp: 20 18   Filed Vitals:   08/22/15 1337 08/22/15 2119 08/23/15 0548 08/23/15 1603  BP: 126/84 116/69 103/79 107/83  Pulse: 93 98 116 94  Temp: 98.2 F (36.8 C) 98 F (36.7 C) 97.8 F (36.6 C) 98.2 F (36.8 C)  TempSrc: Oral Oral Oral Oral  Resp: 18 18 20 18   Height:      Weight:      SpO2: 98% 100% 100% 100%     General: Adult female, somewhat tremulous, No acute distress  HEENT: NCAT, MMM  Cardiovascular: Tachycardic RR, nl S1, S2 no mrg, 2+ pulses, warm extremities  Respiratory: CTAB, no increased WOB  Abdomen: NABS, soft, ND, diffusely TTP without rebound or guarding  MSK: Normal tone and bulk, no  LEE  Neuro: Fine tremor throughout  Discharge Instructions     Medication List    ASK your doctor about these medications        insulin aspart 100 UNIT/ML injection  Commonly  known as:  novoLOG  Inject 1-20 Units into the skin 3 (three) times daily before meals. Per sliding scale           Follow-up Information    Follow up with [facility].   Why:  GO to clinic any weekday morning from 9-10am and ask for appt for a primary care physician, follow up medical care, and an appt with navigator for insurance.   Contact information:   [address] [place] [address] [phone]       The results of significant diagnostics from this hospitalization (including imaging, microbiology, ancillary and laboratory) are listed below for reference.    Significant Diagnostic Studies: No results found.  Microbiology: No results found for this or any previous visit (from the past 240 hour(s)).   Labs: Basic Metabolic Panel: No results for input(s): NA, K, CL, CO2, GLUCOSE, BUN, CREATININE, CALCIUM, MG, PHOS in the last 168 hours. Liver Function Tests: No results for input(s): AST, ALT, ALKPHOS, BILITOT, PROT, ALBUMIN in the last 168 hours. No results for input(s): LIPASE, AMYLASE in the last 168 hours. No results for input(s): AMMONIA in the last 168 hours. CBC: No results for input(s): WBC, NEUTROABS, HGB, HCT, MCV, PLT in the last 168 hours. Cardiac Enzymes: No results for input(s): CKTOTAL, CKMB, CKMBINDEX, TROPONINI in the last 168 hours. BNP: BNP (last 3 results) No results for input(s): BNP in the last 8760 hours.  ProBNP (last 3 results) No results for input(s): PROBNP in the last 8760 hours.  CBG: No results for input(s): GLUCAP in the last 168 hours.  Time coordinating discharge: 35 minutes  Signed:  Faron Tudisco  [facility] 09/25/2015, 5:55 PM

## 2017-09-06 IMAGING — DX DG CHEST 1V PORT
1 series · 1 of 1 positions shown · non-contrast
Comparison: None.

CLINICAL DATA: Diabetic ketoacidosis.  Cough.

EXAM:
PORTABLE CHEST 1 VIEW

[chest ap]
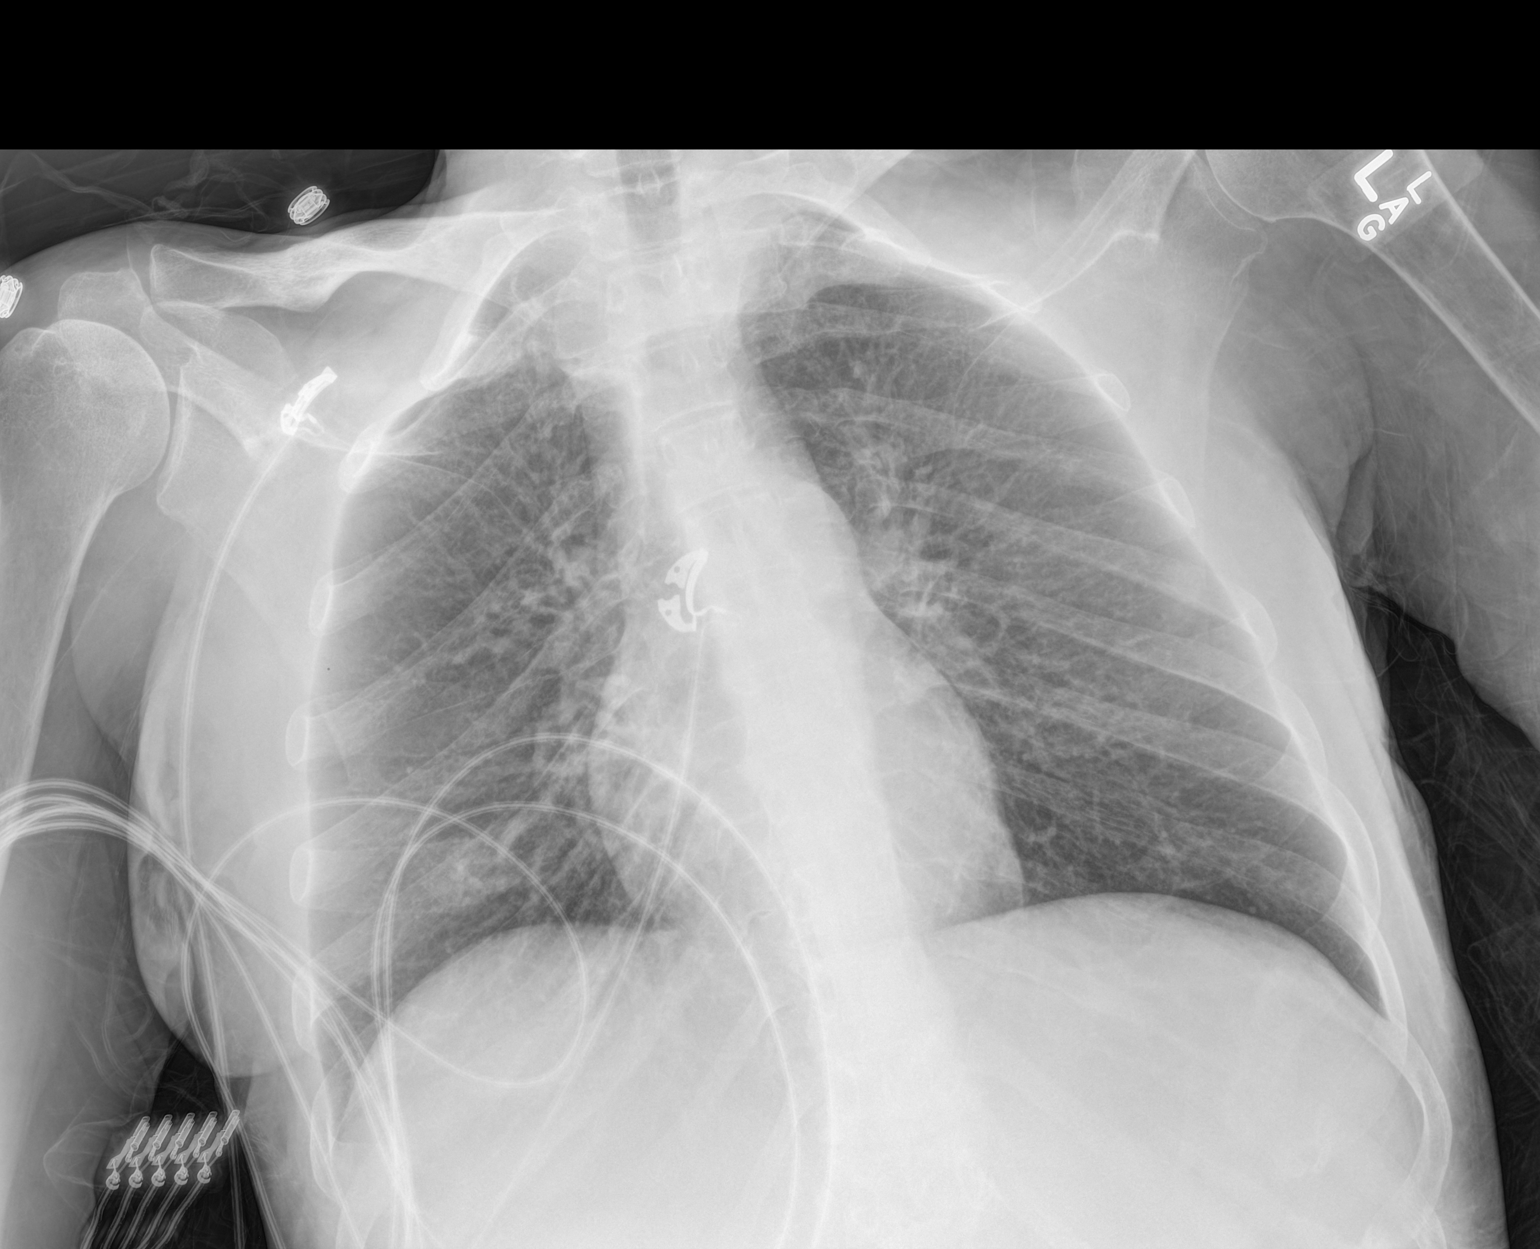

[1 of 1 positions shown; findings below may reference images not displayed]

FINDINGS: Cardiac silhouette is normal in size and configuration. Normal
mediastinal and hilar contours.

There are irregularly thickened bronchovascular/interstitial
markings which predominate centrally. No lung consolidation is seen
to suggest pneumonia. No pleural effusion or pneumothorax.

Skeletal structures are unremarkable.
IMPRESSION: 1. Bilateral thickened bronchovascular/interstitial markings. This
could be due to diffuse bronchitis. Findings may reflect mild
interstitial edema. No evidence of lobar pneumonia.

## 2017-09-08 IMAGING — CT CT CHEST W/O CM
2 of 4 series · 15 of 36 positions shown, 18 images · non-contrast
Comparison: None.

CLINICAL DATA: Left-sided chest pain.

EXAM:
CT CHEST WITHOUT CONTRAST
TECHNIQUE: Multidetector CT imaging of the chest was performed following the
standard protocol without IV contrast.

[Series 2: chest w/o st · axial · non-contrast · 0.74mm/px · z∈[-238,-28]mm · 12 of 50 slices shown, 15 images]
[im 4/50  mediastinal]
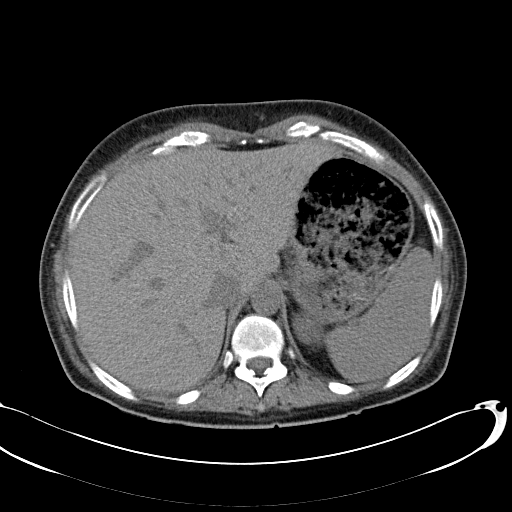
[im 4/50  lung]
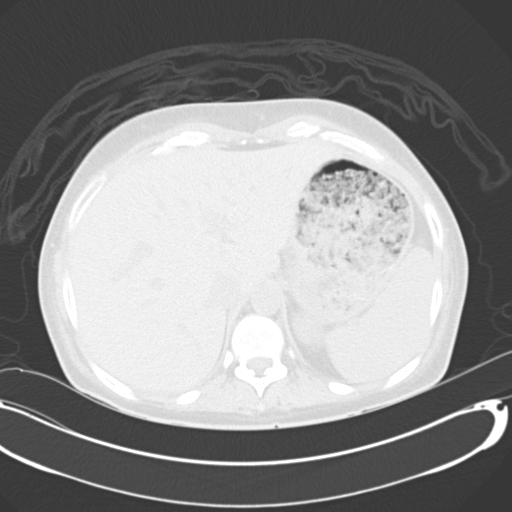
[im 8/50  lung]
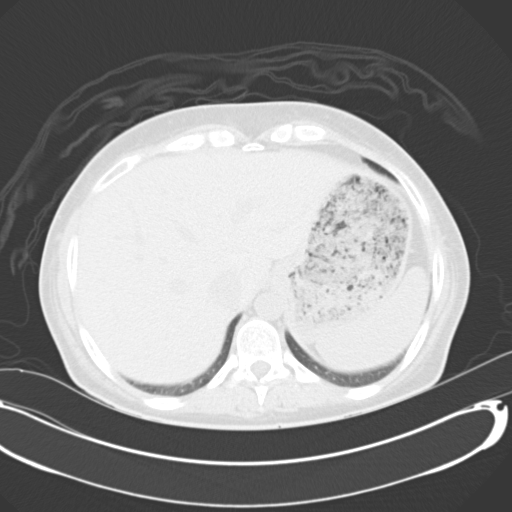
[im 12/50  lung]
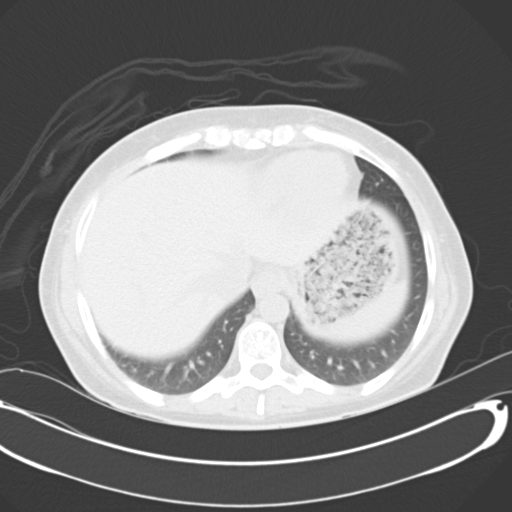
[im 16/50  lung]
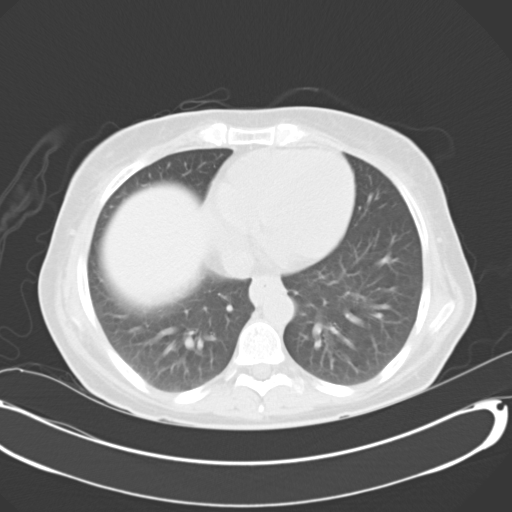
[im 19/50  mediastinal]
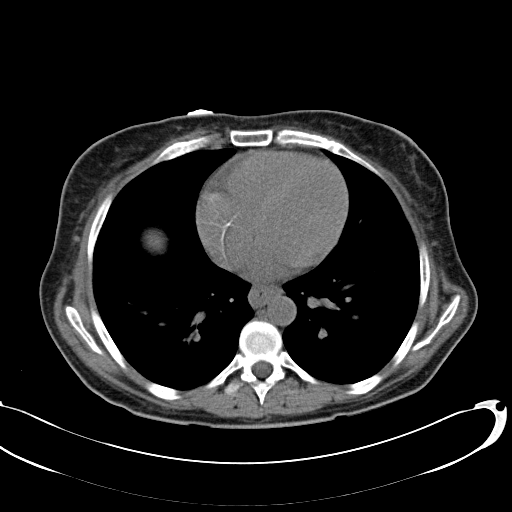
[im 19/50  lung]
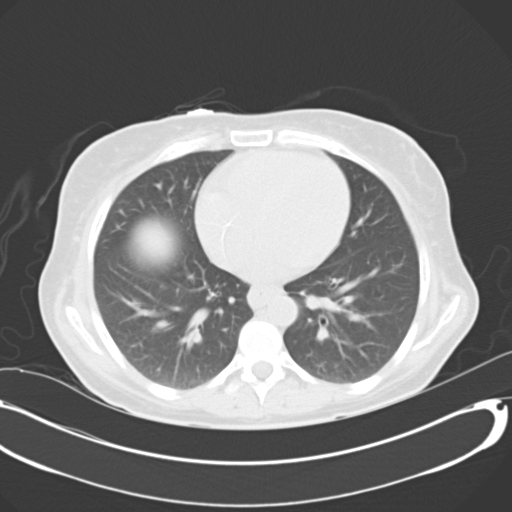
[im 23/50  lung]
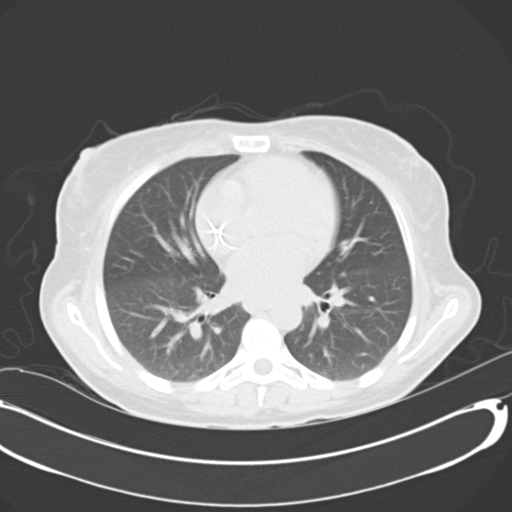
[im 27/50  lung]
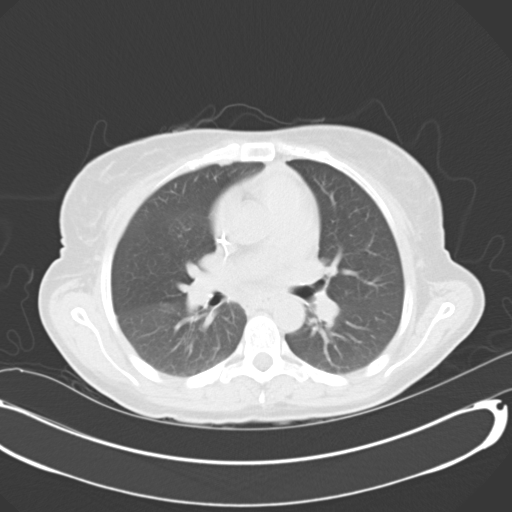
[im 31/50  lung]
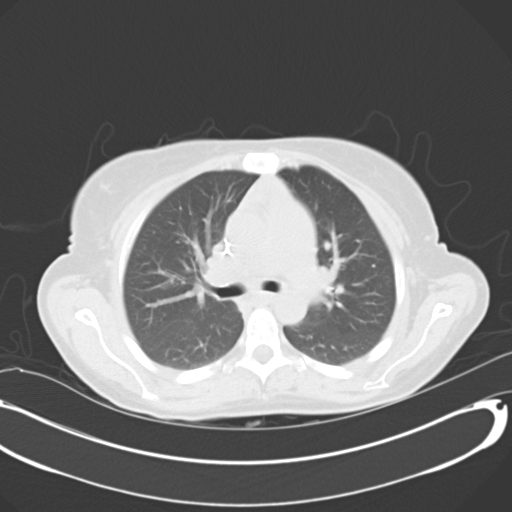
[im 34/50  mediastinal]
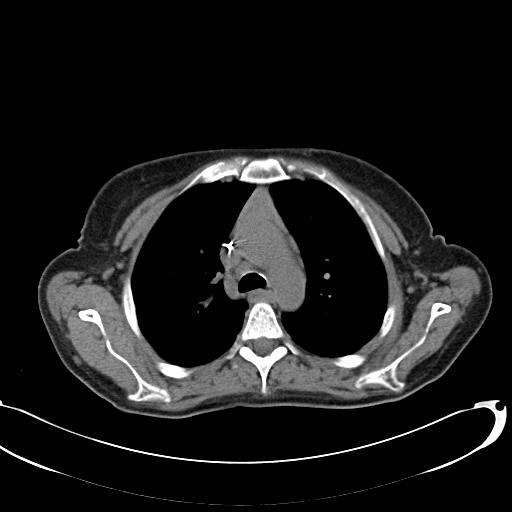
[im 34/50  lung]
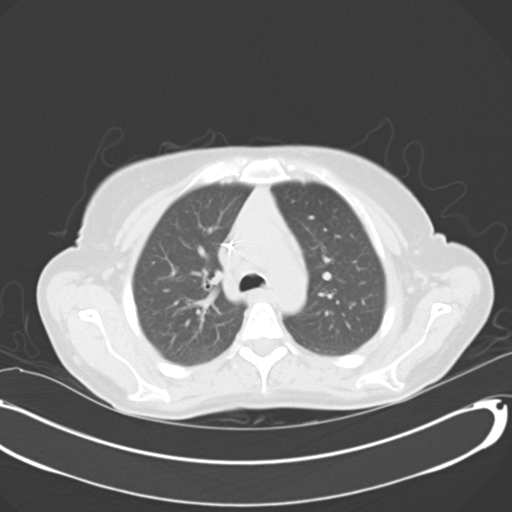
[im 38/50  lung]
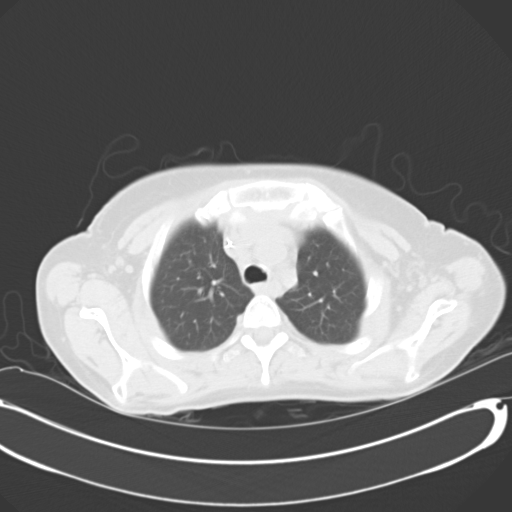
[im 42/50  lung]
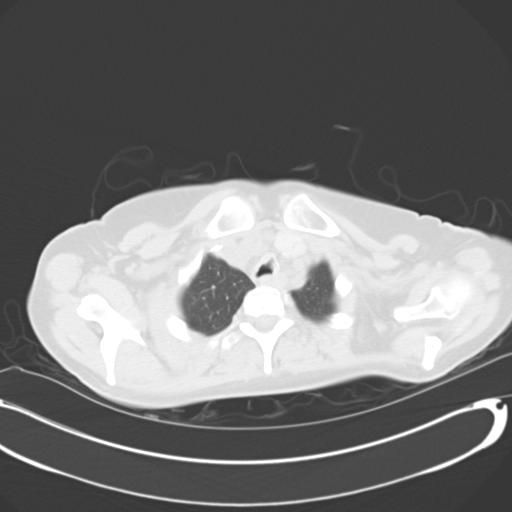
[im 46/50  lung]
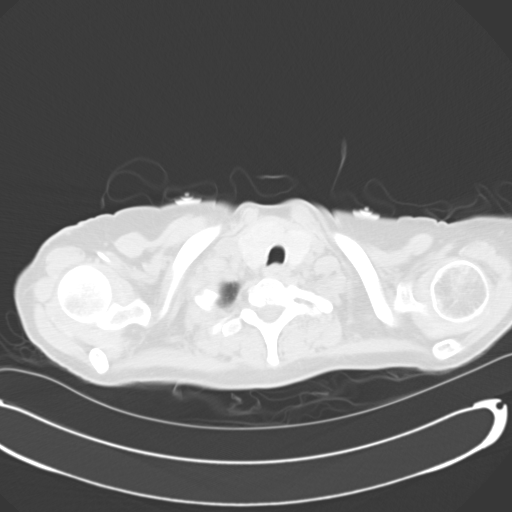

[Series 602: <mpr thick range> · coronal · 0.74mm/px · 3 of 116 slices shown]
[im 24/116  lung]
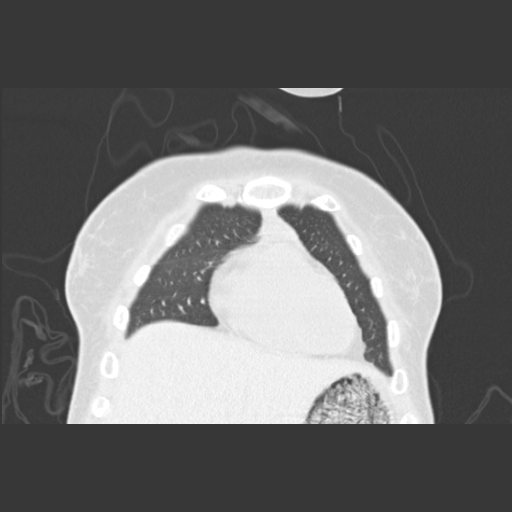
[im 47/116  lung]
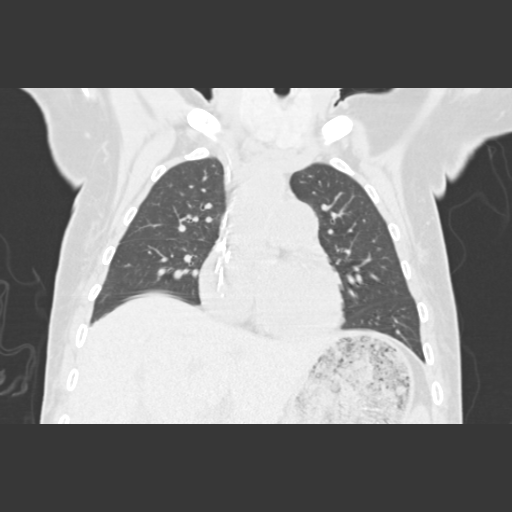
[im 70/116  lung]
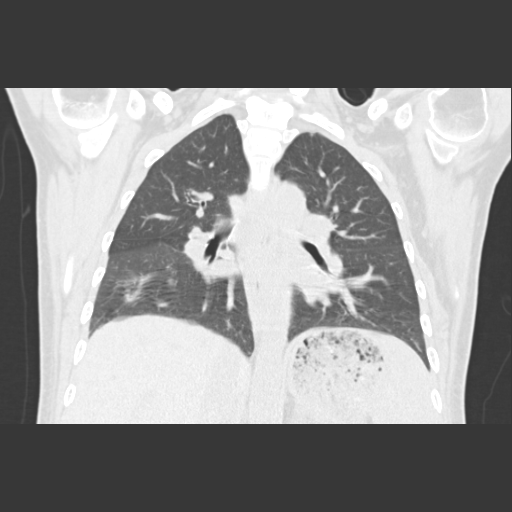

[15 of 36 positions shown; findings below may reference images not displayed]

FINDINGS: No pulmonary nodules, masses, or infiltrates. There is mucus within
the trachea. There is enlargement of the thyroid with no focal
masses. Triangular soft tissue in the anterior mediastinum is
consistent with residual thymus. The borders are concave with no
convexity. Evaluation of the hila is limited without contrast but no
hilar adenopathy is identified. The mediastinal nodes are within
normal limits as well. A right PICC line terminates just in the
right side of the atrium. The axilla and base of neck are otherwise
normal. The chest wall is unremarkable. The esophagus is within
normal limits. The heart size is normal. Central great vessels are
normal in caliber. No effusions.
IMPRESSION: No acute abnormality to explain the patient's symptoms is
identified.

Thyromegaly with no focal masses.
